# Patient Record
Sex: Female | Born: 2000 | Race: White | Smoking: Never smoker
Health system: Southern US, Community
[De-identification: ages and names within clinical notes are randomized; demographics above are authoritative.]

## PROBLEM LIST (undated history)

## (undated) DIAGNOSIS — J45909 Unspecified asthma, uncomplicated: Secondary | ICD-10-CM

## (undated) DIAGNOSIS — T7840XA Allergy, unspecified, initial encounter: Secondary | ICD-10-CM

## (undated) DIAGNOSIS — F32A Depression, unspecified: Secondary | ICD-10-CM

## (undated) DIAGNOSIS — F419 Anxiety disorder, unspecified: Secondary | ICD-10-CM

## (undated) HISTORY — PX: FOOT SURGERY: SHX648

## (undated) HISTORY — PX: ANKLE SURGERY: SHX546

## (undated) HISTORY — PX: TONSILLECTOMY AND ADENOIDECTOMY: SUR1326

## (undated) HISTORY — DX: Depression, unspecified: F32.A

## (undated) HISTORY — DX: Allergy, unspecified, initial encounter: T78.40XA

## (undated) HISTORY — PX: WISDOM TOOTH EXTRACTION: SHX21

---

## 2021-08-02 ENCOUNTER — Other Ambulatory Visit: Payer: Self-pay | Admitting: Orthopedic Surgery

## 2021-08-02 DIAGNOSIS — M79671 Pain in right foot: Secondary | ICD-10-CM

## 2021-08-08 ENCOUNTER — Other Ambulatory Visit: Payer: Self-pay

## 2021-08-08 ENCOUNTER — Ambulatory Visit
Admission: RE | Admit: 2021-08-08 | Discharge: 2021-08-08 | Disposition: A | Discharge status: Home / Self Care | Payer: 59 | Source: Ambulatory Visit | Attending: Orthopedic Surgery | Admitting: Orthopedic Surgery

## 2021-08-08 DIAGNOSIS — M79671 Pain in right foot: Secondary | ICD-10-CM | POA: Insufficient documentation

## 2021-11-07 ENCOUNTER — Ambulatory Visit
Admission: RE | Admit: 2021-11-07 | Discharge: 2021-11-07 | Disposition: A | Discharge status: Home / Self Care | Payer: 59 | Source: Ambulatory Visit | Attending: Family Medicine | Admitting: Family Medicine

## 2021-11-07 ENCOUNTER — Ambulatory Visit
Admission: RE | Admit: 2021-11-07 | Discharge: 2021-11-07 | Disposition: A | Discharge status: Home / Self Care | Payer: 59 | Attending: Family Medicine | Admitting: Family Medicine

## 2021-11-07 ENCOUNTER — Other Ambulatory Visit: Payer: Self-pay

## 2021-11-07 ENCOUNTER — Other Ambulatory Visit: Payer: Self-pay | Admitting: Family Medicine

## 2021-11-07 DIAGNOSIS — R52 Pain, unspecified: Secondary | ICD-10-CM

## 2021-11-20 ENCOUNTER — Ambulatory Visit: Payer: 59 | Admitting: Neurology

## 2021-11-20 ENCOUNTER — Telehealth: Payer: Self-pay | Admitting: Neurology

## 2021-11-20 NOTE — Telephone Encounter (Signed)
In response to an appointment request from 11-19-21 phone rep called pt to reschedule her appointment, phone rep left vm with office # asking for a call back to reschedule appointment.  This is FYI ?

## 2021-12-14 ENCOUNTER — Ambulatory Visit
Admission: RE | Admit: 2021-12-14 | Discharge: 2021-12-14 | Disposition: A | Discharge status: Home / Self Care | Payer: PRIVATE HEALTH INSURANCE | Source: Ambulatory Visit

## 2021-12-14 VITALS — BP 103/54 | HR 79 | Temp 98.2°F | Resp 16

## 2021-12-14 DIAGNOSIS — H60392 Other infective otitis externa, left ear: Secondary | ICD-10-CM | POA: Diagnosis not present

## 2021-12-14 DIAGNOSIS — H6012 Cellulitis of left external ear: Secondary | ICD-10-CM

## 2021-12-14 HISTORY — DX: Unspecified asthma, uncomplicated: J45.909

## 2021-12-14 HISTORY — DX: Anxiety disorder, unspecified: F41.9

## 2021-12-14 HISTORY — DX: Allergy, unspecified, initial encounter: T78.40XA

## 2021-12-14 MED ORDER — DOXYCYCLINE HYCLATE 100 MG PO CAPS: 100.0000 mg | ORAL_CAPSULE | Freq: Two times a day (BID) | ORAL | 0 refills | Status: AC

## 2021-12-14 NOTE — ED Provider Notes (Signed)
?[facility] ? ? ? ?CSN: [id number] ?Arrival date & time: 12/14/21  1314 ? ? ?  ? ?History   ?Chief Complaint ?Chief Complaint  ?Patient presents with  ? Otalgia  ?  left ear lobe pain   ? ? ?HPI ?Jasmine Kane is a 21 y.o. female.  ? 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  ?She endorses that inner ear canal papules is painful. She has no other symptoms. The area is reddened. Patient has no other symptoms. ?Past Medical History:  ?Diagnosis Date  ? Allergies   ? Anxiety   ? Asthma   ? ? ?There are no problems to display for this patient. ? ? ?History reviewed. No pertinent surgical history. ? ?OB History   ?No obstetric history on file. ?  ? ? ? ?Home Medications   ? ?Prior to Admission medications   ?Medication Sig Start Date End Date Taking? Authorizing Provider  ?doxycycline (VIBRAMYCIN) 100 MG capsule Take 1 capsule (100 mg total) by mouth 2 (two) times daily for 7 days. 12/14/21 12/21/21 Yes Bing Neighbors, FNP  ?sertraline (ZOLOFT) 100 MG tablet Take by mouth. 10/01/21  Yes [provider]  ?Cetirizine-Pseudoephedrine (ZYRTEC-D PO) ZyrTEC    [provider]  ?Montelukast Sodium (SINGULAIR PO) Singulair    [provider]  ? ? ?Family History ?History reviewed. No pertinent family history. ? ?Social History ?Social History  ? ?Tobacco Use  ? Smoking status: Never  ? Smokeless tobacco: Never  ?Vaping Use  ? Vaping Use: Never used  ?Substance Use Topics  ? Alcohol use: Never  ? Drug use: Never  ? ? ? ?Allergies   ?Mixed grasses ? ? ?Review of Systems ?Review of Systems ?Pertinent negatives listed in HPI  ? ?Physical Exam ?Triage Vital Signs ?ED Triage Vitals  ?Enc Vitals Group  ?   BP 12/14/21 1330 (!) 103/54  ?   Pulse Rate 12/14/21 1330 79  ?   Resp 12/14/21 1330 16  ?   Temp 12/14/21 1330 98.2 ?F (36.8 ?C)  ?   Temp Source 12/14/21 1330 Temporal  ?   SpO2 12/14/21 1330 97 %  ?   Weight  --   ?   Height --   ?   Head Circumference --   ?   Peak Flow --   ?   Pain Score 12/14/21 1333 7  ?   Pain Loc --   ?   Pain Edu? --   ?   Excl. in GC? --   ? ?No data found. ? ?Updated Vital Signs ?BP (!) 103/54 (BP Location: Left Arm)   Pulse 79   Temp 98.2 ?F (36.8 ?C) (Temporal)   Resp 16   SpO2 97%  ? ?Visual Acuity ?Right Eye Distance:   ?Left Eye Distance:   ?Bilateral Distance:   ? ?Right Eye Near:   ?Left Eye Near:    ?Bilateral Near:    ? ?Physical Exam ?General appearance: Alert, well developed, well nourished, cooperative  ?Head: Normocephalic, without obvious abnormality, atraumatic ?Respiratory: Respirations even and unlabored, normal respiratory rate ?Heart: Rate and rhythm normal.   ?Extremities: No gross deformities ?Skin: nodular papule auricle of left ear (consistent is scar tissue from prior piercing-non erythematous no drainage), turgor normal. Fine pustular papules inner ear canal with surrounding erythema present, no active drainage from lesion  ?Psych: Appropriate mood and affect. ? ? ?UC Treatments /  Results  ?Labs ?(all labs ordered are listed, but only abnormal results are displayed) ?Labs Reviewed - No data to display ? ?EKG ? ? ?Radiology ?No results found. ? ?Procedures ?Procedures (including critical care time) ? ?Medications Ordered in UC ?Medications - No data to display ? ?Initial Impression / Assessment and Plan / UC Course  ?I have reviewed the triage vital signs and the nursing notes. ? ?Pertinent labs & imaging results that were available during my care of the patient were reviewed by me and considered in my medical decision making (see chart for details). ? ?  ?Short course of doxycyline for inner ear cellulitis. Patient has other papules which appear to be clusters of eruptions suspicious for acne, doxycycline will be beneficial in clearing these eruptions. Follow-up with dermatology to discuss removal of scar tissue from prior piercing. ?Final Clinical Impressions(s) /  UC Diagnoses  ? ?Final diagnoses:  ?Cellulitis of left ear canal  ? ?Discharge Instructions   ?None ?  ? ?ED Prescriptions   ? ? Medication Sig Dispense Auth. Provider  ? doxycycline (VIBRAMYCIN) 100 MG capsule Take 1 capsule (100 mg total) by mouth 2 (two) times daily for 7 days. 14 capsule Bing Neighbors, FNP  ? ?  ? ?PDMP not reviewed this encounter. ?  ?Bing Neighbors, FNP ?12/18/21 1819 ? ?

## 2021-12-14 NOTE — ED Triage Notes (Signed)
Patient presents to Urgent Care with complaints of left ear lobe pain since 2 months. She states she was treated for ear lobe infection 2 months ago, she recently felt a pimple in her inner ear. She wants to make sure she does not have an infection.  ?

## 2022-03-28 ENCOUNTER — Ambulatory Visit: Payer: 59 | Admitting: Adult Health

## 2022-03-28 ENCOUNTER — Encounter: Payer: Self-pay | Admitting: Adult Health

## 2022-03-28 VITALS — Temp 98.5°F

## 2022-03-28 DIAGNOSIS — S65312A Laceration of deep palmar arch of left hand, initial encounter: Secondary | ICD-10-CM

## 2022-03-28 NOTE — Progress Notes (Signed)
[facility] [address] [place], [place] [address] Phone: [phone] Fax: [phone]   Office Visit Note  Patient Name: Jasmine Kane  Date of [id number]  Med Rec number [id number]  Date of Service: 03/28/2022  Mixed grasses  Chief Complaint  Patient presents with   Hand Injury    Pt cut right hand on can opener.      Hand Injury    Patient is here reporting about 30 minute ago she was using a can opener and slipped.  She cut the palm of her right hand at the base of her thumb. She has controlled the bleeding, but feels like she needs a couple stitches. Last tetanus 9 years ago.    Current Medication:  Outpatient Encounter Medications as of 03/28/2022  Medication Sig   Cetirizine-Pseudoephedrine (ZYRTEC-D PO) ZyrTEC   levonorgestrel (KYLEENA) 19.5 MG IUD 1 each by Intrauterine route once.   Montelukast Sodium (SINGULAIR PO) Singulair   sertraline (ZOLOFT) 50 MG tablet TAKE 1 TABLET BY MOUTH EVERY DAY WITH 25 MG SERTRALINE (75 MG TOTAL DAILY)   sertraline (ZOLOFT) 100 MG tablet Take by mouth. (Patient not taking: Reported on 03/28/2022)   No facility-administered encounter medications on file as of 03/28/2022.      Medical History: Past Medical History:  Diagnosis Date   Allergies    Anxiety    Asthma      Vital Signs: Temp 98.5 F (36.9 C) (Tympanic)    Review of Systems  Constitutional:  Negative for fatigue and fever.  Skin:  Positive for wound.    Physical Exam Vitals and nursing note reviewed.  Skin:    Findings: Laceration present.          Comments: 2cm laceration at base of left thumb.        Assessment/Plan: 1. Laceration of deep palmar arch of left hand, initial encounter Patient wound anesthestized with 1% lidocaine.  3 interruped sutures placed in laceartion.  Wound draessed with non-adherent bandage, triple antibiotic cream and coban. Return in one week for suture removal. Keep area clean and dry, do not submerge  in water of any kind.  - Tdap vaccine greater than or equal to 7yo IM     General Counseling: Amayra verbalizes understanding of the findings of todays visit and agrees with plan of treatment. I have discussed any further diagnostic evaluation that may be needed or ordered today. We also reviewed her medications today. she has been encouraged to call the office with any questions or concerns that should arise related to todays visit.   Orders Placed This Encounter  Procedures   Tdap vaccine greater than or equal to 7yo IM    No orders of the defined types were placed in this encounter.   Time spent:30 Minutes    Johnna Acosta AGNP-C Nurse Practitioner

## 2022-04-04 ENCOUNTER — Ambulatory Visit (INDEPENDENT_AMBULATORY_CARE_PROVIDER_SITE_OTHER): Payer: 59 | Admitting: Adult Health

## 2022-04-04 DIAGNOSIS — Z4802 Encounter for removal of sutures: Secondary | ICD-10-CM | POA: Diagnosis not present

## 2022-04-04 NOTE — Progress Notes (Signed)
[facility] [address] [place], [place] [address] Phone: [phone] Fax: [phone]   Office Visit Note  Patient Name: Jasmine Kane  Date of [id number]  Med Rec number [id number]  Date of Service: 04/04/2022  Mixed grasses  Chief Complaint  Patient presents with   Suture / Staple Removal     Suture / Staple Removal   Patient here for suture removal.  See previous OV note.  Sutures placed one week ago.    Current Medication:  Outpatient Encounter Medications as of 04/04/2022  Medication Sig   Cetirizine-Pseudoephedrine (ZYRTEC-D PO) ZyrTEC   levonorgestrel (KYLEENA) 19.5 MG IUD 1 each by Intrauterine route once.   Montelukast Sodium (SINGULAIR PO) Singulair   sertraline (ZOLOFT) 100 MG tablet Take by mouth. (Patient not taking: Reported on 03/28/2022)   sertraline (ZOLOFT) 50 MG tablet TAKE 1 TABLET BY MOUTH EVERY DAY WITH 25 MG SERTRALINE (75 MG TOTAL DAILY)   No facility-administered encounter medications on file as of 04/04/2022.      Medical History: Past Medical History:  Diagnosis Date   Allergies    Anxiety    Asthma      Vital Signs: There were no vitals taken for this visit.   Review of Systems  Constitutional: Negative.     Physical Exam Constitutional:      Appearance: Normal appearance.  Skin:    Comments: Right palmer surface with laceration.  Wound is well approximated, no redness or drainage noted. Three sutures in place.   Neurological:     Mental Status: She is alert.     Assessment/Plan: 1. Visit for suture removal 3 interrupted sutures removed.  Wound covered with steri-strips.  Follow up via MyChart messenger with any concerns, issues or questions.     General Counseling: Jasmine Kane verbalizes understanding of the findings of todays visit and agrees with plan of treatment. I have discussed any further diagnostic evaluation that may be needed or ordered today. We also reviewed her medications today. she has been  encouraged to call the office with any questions or concerns that should arise related to todays visit.   No orders of the defined types were placed in this encounter.   No orders of the defined types were placed in this encounter.   Time spent:15 Minutes    Johnna Acosta AGNP-C Nurse Practitioner

## 2022-04-08 ENCOUNTER — Encounter: Payer: Self-pay | Admitting: Family Medicine

## 2022-04-08 DIAGNOSIS — H1045 Other chronic allergic conjunctivitis: Secondary | ICD-10-CM | POA: Insufficient documentation

## 2022-04-08 DIAGNOSIS — J309 Allergic rhinitis, unspecified: Secondary | ICD-10-CM | POA: Insufficient documentation

## 2022-05-07 ENCOUNTER — Ambulatory Visit (INDEPENDENT_AMBULATORY_CARE_PROVIDER_SITE_OTHER): Payer: 59 | Admitting: Adult Health

## 2022-05-07 ENCOUNTER — Encounter: Payer: Self-pay | Admitting: Adult Health

## 2022-05-07 VITALS — BP 108/64 | HR 77 | Temp 99.5°F | Resp 18 | Ht 64.0 in | Wt 148.0 lb

## 2022-05-07 DIAGNOSIS — J22 Unspecified acute lower respiratory infection: Secondary | ICD-10-CM | POA: Diagnosis not present

## 2022-05-07 MED ORDER — AZITHROMYCIN 250 MG PO TABS: ORAL_TABLET | ORAL | 0 refills | Status: DC

## 2022-05-07 MED ORDER — AZITHROMYCIN 250 MG PO TABS: ORAL_TABLET | ORAL | 0 refills | Status: AC

## 2022-05-07 NOTE — Addendum Note (Signed)
Addended by: Johnna Acosta on: 05/07/2022 03:49 PM   Modules accepted: Orders

## 2022-05-07 NOTE — Progress Notes (Signed)
[facility] [address] [place], [place] [address] Phone: [phone] Fax: [phone]   Office Visit Note  Patient Name: Jasmine Kane  Date of [id number]  Med Rec number [id number]  Date of Service: 05/07/2022  Mixed grasses  No chief complaint on file.    HPI Patient reports just over a week ago she started having body aches and fatigue.  She reports it improved over the next two days.  About 4 days ago she started having difficulty breathing, and feeling like she was having asthma attacks. She reports coughing up mucous, and green mucous from her nose.  She has severe allergies, and is on xyzol, Flonase and Singulair.  She has asthma and has been using her albuterol inhaler.  She also has a breo but does not use it consistently.    Current Medication:  Outpatient Encounter Medications as of 05/07/2022  Medication Sig   azithromycin (ZITHROMAX) 250 MG tablet Take 2 tablets on day 1, then 1 tablet daily on days 2 through 5   Cetirizine-Pseudoephedrine (ZYRTEC-D PO) ZyrTEC   levonorgestrel (KYLEENA) 19.5 MG IUD 1 each by Intrauterine route once.   Montelukast Sodium (SINGULAIR PO) Singulair   sertraline (ZOLOFT) 50 MG tablet TAKE 1 TABLET BY MOUTH EVERY DAY WITH 25 MG SERTRALINE (75 MG TOTAL DAILY)   sertraline (ZOLOFT) 100 MG tablet Take by mouth. (Patient not taking: Reported on 05/07/2022)   No facility-administered encounter medications on file as of 05/07/2022.      Medical History: Past Medical History:  Diagnosis Date   Allergic rhinitis 04/08/2022   Allergies    Anxiety    Asthma      Vital Signs: BP 108/64   Pulse 77   Temp 99.5 F (37.5 C) (Tympanic)   Resp 18   Ht 5\' 4"  (1.626 m)   Wt 148 lb (67.1 kg)   SpO2 99%   BMI 25.40 kg/m    Review of Systems  HENT:  Positive for sinus pressure and sore throat.   Respiratory:  Positive for cough and wheezing.     Physical Exam Constitutional:      Appearance: Normal appearance.  HENT:      Right Ear: Tympanic membrane and ear canal normal.     Left Ear: Tympanic membrane and ear canal normal.     Nose: Nose normal.     Mouth/Throat:     Mouth: Mucous membranes are moist.  Eyes:     Pupils: Pupils are equal, round, and reactive to light.  Cardiovascular:     Rate and Rhythm: Normal rate.  Pulmonary:     Effort: Pulmonary effort is normal.     Breath sounds: Wheezing present. No rales.     Comments: Mild course breath sounds in lower lungs.  Abdominal:     General: Abdomen is flat.  Neurological:     Mental Status: She is alert.    Assessment/Plan: 1. Lower respiratory infection Covid negative in clinic Take complete course of antibiotics as prescribed.  Take with food.  If symptoms fail to improve, or new/worse symptoms develop follow up in clinic.  Follow up via MyChart messenger if symptoms fail to improve or may return to clinic as needed for worsening symptoms.  USE BREO daily as prescribed.  Continue albuterol.  - azithromycin (ZITHROMAX) 250 MG tablet; Take 2 tablets on day 1, then 1 tablet daily on days 2 through 5  Dispense: 6 tablet; Refill: 0        General  Counseling: Jasmine Kane verbalizes understanding of the findings of todays visit and agrees with plan of treatment. I have discussed any further diagnostic evaluation that may be needed or ordered today. We also reviewed her medications today. she has been encouraged to call the office with any questions or concerns that should arise related to todays visit.   No orders of the defined types were placed in this encounter.   Meds ordered this encounter  Medications   azithromycin (ZITHROMAX) 250 MG tablet    Sig: Take 2 tablets on day 1, then 1 tablet daily on days 2 through 5    Dispense:  6 tablet    Refill:  0    Time spent:20 Minutes    Johnna Acosta AGNP-C Nurse Practitioner

## 2022-10-03 ENCOUNTER — Ambulatory Visit (INDEPENDENT_AMBULATORY_CARE_PROVIDER_SITE_OTHER): Payer: Commercial Managed Care - PPO | Admitting: Family Medicine

## 2022-10-03 ENCOUNTER — Encounter: Payer: Self-pay | Admitting: Family Medicine

## 2022-10-03 VITALS — BP 114/62 | HR 95 | Temp 99.1°F | Ht 66.0 in | Wt 150.0 lb

## 2022-10-03 DIAGNOSIS — R509 Fever, unspecified: Secondary | ICD-10-CM

## 2022-10-03 DIAGNOSIS — J101 Influenza due to other identified influenza virus with other respiratory manifestations: Secondary | ICD-10-CM

## 2022-10-03 LAB — POC SOFIA 2 FLU + SARS ANTIGEN FIA
Influenza A, POC: POSITIVE — AB
Influenza B, POC: NEGATIVE
SARS Coronavirus 2 Ag: NEGATIVE

## 2022-10-03 MED ORDER — OSELTAMIVIR PHOSPHATE 75 MG PO CAPS: 75.0000 mg | ORAL_CAPSULE | Freq: Two times a day (BID) | ORAL | 0 refills | Status: AC

## 2022-10-03 NOTE — Progress Notes (Signed)
[address] 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  Fever  Associated symptoms include congestion.      Current Medication:  Outpatient Encounter Medications as of 10/03/2022  Medication Sig   Cetirizine-Pseudoephedrine (ZYRTEC-D PO) ZyrTEC   fluticasone (FLONASE ALLERGY RELIEF) 50 MCG/ACT nasal spray Place 2 sprays into both nostrils daily.   levocetirizine (XYZAL) 5 MG tablet Take 2.5 mg by mouth every evening.   levonorgestrel (KYLEENA) 19.5 MG IUD 1 each by Intrauterine route once.   Montelukast Sodium (SINGULAIR PO) Singulair   sertraline (ZOLOFT) 100 MG tablet Take by mouth. (Patient not taking: Reported on 05/07/2022)   sertraline (ZOLOFT) 50 MG tablet TAKE 1 TABLET BY MOUTH EVERY DAY WITH 25 MG SERTRALINE (75 MG TOTAL DAILY) (Patient not taking: Reported on 10/03/2022)   No facility-administered encounter medications on file as of 10/03/2022.      Medical History: Past Medical History:  Diagnosis Date   Allergic rhinitis 04/08/2022   Allergies    Anxiety    Asthma      Vital Signs: BP 114/62   Pulse 95   Temp 99.1 F (37.3 C) (Tympanic)   Ht 5\' 6"  (1.676 m)   Wt 150 lb (68 kg)   SpO2 99%   BMI 24.21 kg/m    Review of Systems  Constitutional:  Positive for fatigue and fever.  HENT:  Positive for congestion and rhinorrhea.   Musculoskeletal:  Positive for myalgias.    Physical  Exam Vitals reviewed.  Constitutional:      Comments: Looks tired  HENT:     Right Ear: Tympanic membrane normal.     Left Ear: Tympanic membrane normal.     Nose: Congestion and rhinorrhea present. Rhinorrhea is clear.     Right Sinus: No maxillary sinus tenderness or frontal sinus tenderness.     Left Sinus: No maxillary sinus tenderness or frontal sinus tenderness.     Mouth/Throat:     Mouth: Mucous membranes are moist.     Pharynx: Oropharynx is clear. Uvula midline. No oropharyngeal exudate or posterior oropharyngeal erythema.  Eyes:     Conjunctiva/sclera: Conjunctivae normal.  Pulmonary:     Effort: Pulmonary effort is normal.     Breath sounds: Normal breath sounds and air entry.  Lymphadenopathy:     Cervical: No cervical adenopathy.  Neurological:     Mental Status: She is alert.     Assessment/Plan:  1. Fever, unspecified fever cause  - POC SOFIA 2 FLU + SARS ANTIGEN FIA  Results for orders placed or performed in visit on 10/03/22 (from the past 24 hour(s))  POC SOFIA 2 FLU + SARS ANTIGEN FIA     Status: Abnormal   Collection Time: 10/03/22  3:21 PM  Result Value Ref Range   Influenza A, POC Positive (A) Negative   Influenza B, POC Negative Negative   SARS Coronavirus 2 Ag Negative  Negative     2. Flu due to oth ident influenza virus w oth resp manifest  - oseltamivir (TAMIFLU) 75 MG capsule; Take 1 capsule (75 mg total) by mouth 2 (two) times daily for 5 days.  Dispense: 10 capsule; Refill: 0    May return to class when fever below 100.5  General Counseling: Jasmine Kane verbalizes understanding of the findings of todays visit and agrees with plan of treatment. I have discussed any further diagnostic evaluation that may be needed or ordered today. We also reviewed her medications today. she has been encouraged to call the office with any questions or concerns that should arise related to todays visit.   No orders of the defined types were placed in this  encounter.   No orders of the defined types were placed in this encounter.     Dr Evette Doffing Emilian Stawicki ABFM University Physician

## 2022-11-12 ENCOUNTER — Ambulatory Visit (INDEPENDENT_AMBULATORY_CARE_PROVIDER_SITE_OTHER): Payer: Commercial Managed Care - PPO | Admitting: Family Medicine

## 2022-11-12 VITALS — BP 122/70 | HR 63 | Temp 98.5°F | Resp 18 | Ht 66.0 in | Wt 150.0 lb

## 2022-11-12 DIAGNOSIS — J3089 Other allergic rhinitis: Secondary | ICD-10-CM

## 2022-11-12 DIAGNOSIS — J0191 Acute recurrent sinusitis, unspecified: Secondary | ICD-10-CM | POA: Diagnosis not present

## 2022-11-12 DIAGNOSIS — L729 Follicular cyst of the skin and subcutaneous tissue, unspecified: Secondary | ICD-10-CM | POA: Diagnosis not present

## 2022-11-12 MED ORDER — AMOXICILLIN-POT CLAVULANATE 875-125 MG PO TABS: 1.0000 | ORAL_TABLET | Freq: Two times a day (BID) | ORAL | 0 refills | Status: DC

## 2022-11-12 NOTE — Progress Notes (Signed)
[address] [place], [place] [address] Phone: [phone] Fax: [phone]   Office Visit Note  Patient Name: Jasmine Kane  Date of [id number]  Med Rec number [id number]  Date of Service: 11/12/2022  Mixed grasses  Chief Complaint  Patient presents with   Ear Pain     Left ear swelling a few weeks ago and felt like a pimple at the start and can't  put her ear bud in  Has been taking her allergy meds as usual but today woke up with fever 100.5, and sinus pain (has taken advil) Has history of recurrent sinus infection and takes her allergy medications year round  Currently taking Tizanidine as needed at bedtime for back pain      Current Medication:  Outpatient Encounter Medications as of 11/12/2022  Medication Sig   Cetirizine-Pseudoephedrine (ZYRTEC-D PO) ZyrTEC   fluticasone (FLONASE ALLERGY RELIEF) 50 MCG/ACT nasal spray Place 2 sprays into both nostrils daily.   levocetirizine (XYZAL) 5 MG tablet Take 2.5 mg by mouth every evening.   levonorgestrel (KYLEENA) 19.5 MG IUD 1 each by Intrauterine route once.   Montelukast Sodium (SINGULAIR PO) Singulair   sertraline (ZOLOFT) 100 MG tablet Take by mouth. (Patient not taking: Reported on 05/07/2022)   sertraline (ZOLOFT) 50 MG tablet TAKE 1 TABLET BY MOUTH EVERY DAY WITH 25 MG SERTRALINE (75 MG TOTAL DAILY) (Patient not taking: Reported on 10/03/2022)   No facility-administered encounter medications on file as of 11/12/2022.      Medical History: Past Medical History:  Diagnosis Date   Allergic rhinitis 04/08/2022   Allergies    Anxiety    Asthma      Vital Signs: BP 122/70   Pulse 63   Temp 98.5 F (36.9 C) (Tympanic)   Resp 18   Ht '5\' 6"'$  (1.676 m)   Wt 150 lb (68 kg)   SpO2 99%   BMI 24.21 kg/m    Review of Systems  Constitutional:  Positive for fever.  HENT:  Positive for sinus pressure and sinus pain.   Musculoskeletal:  Positive for back pain.  Allergic/Immunologic:  Positive for environmental allergies.    Physical Exam Vitals reviewed.  Constitutional:      General: She is not in acute distress.    Appearance: She is not ill-appearing.  HENT:     Right Ear: Tympanic membrane and external ear normal.     Left Ear: Tympanic membrane normal.     Ears:     Comments: Fluid-filled cyst just inside left ear canal - not red or tender to palpation    Nose: Nasal tenderness, congestion and rhinorrhea present.     Right Turbinates: Swollen.     Left Turbinates: Swollen.     Mouth/Throat:     Mouth: Mucous membranes are moist.     Pharynx: Uvula midline. No pharyngeal swelling, oropharyngeal exudate or posterior oropharyngeal erythema.  Neurological:     Mental Status: She is alert.    Assessment/Plan:  1. Acute recurrent sinusitis, unspecified location  - amoxicillin-clavulanate (AUGMENTIN) 875-125 MG tablet; Take 1 tablet by mouth 2 (two) times daily.  Dispense: 20 tablet; Refill: 0  2. Non-seasonal allergic rhinitis, unspecified trigger Continue current medications but increase flonase to 2 sprays in each nostril 2 times daily  3. Cyst of skin If cyst does not resolve with antibiotics may need to punctured. Jeff will schedule return appointment in 8 or 9 days if needs this done  General Counseling: Henriette verbalizes understanding of the findings of todays visit and agrees with plan of treatment. I have discussed any further diagnostic evaluation that may be needed or ordered today. We also reviewed her medications today. she has been encouraged to call the office with any questions or concerns that should arise related to todays visit.   No orders of the defined types were placed in this encounter.   No orders of the defined types were placed in this encounter.    Dr Evette Doffing Cherry Wittwer ABFM University Physician

## 2022-11-13 ENCOUNTER — Other Ambulatory Visit: Payer: Self-pay | Admitting: Sports Medicine

## 2022-11-13 DIAGNOSIS — M62830 Muscle spasm of back: Secondary | ICD-10-CM

## 2022-11-13 DIAGNOSIS — G8929 Other chronic pain: Secondary | ICD-10-CM

## 2022-11-14 ENCOUNTER — Encounter: Payer: Self-pay | Admitting: Family Medicine

## 2022-11-20 ENCOUNTER — Encounter: Payer: Self-pay | Admitting: Family Medicine

## 2022-11-20 ENCOUNTER — Ambulatory Visit (INDEPENDENT_AMBULATORY_CARE_PROVIDER_SITE_OTHER): Payer: Commercial Managed Care - PPO | Admitting: Family Medicine

## 2022-11-20 VITALS — Temp 97.9°F | Wt 146.0 lb

## 2022-11-20 DIAGNOSIS — R21 Rash and other nonspecific skin eruption: Secondary | ICD-10-CM

## 2022-11-20 DIAGNOSIS — L729 Follicular cyst of the skin and subcutaneous tissue, unspecified: Secondary | ICD-10-CM

## 2022-11-20 DIAGNOSIS — R0981 Nasal congestion: Secondary | ICD-10-CM | POA: Diagnosis not present

## 2022-11-20 NOTE — Progress Notes (Signed)
[address] [place], [place] [address] Phone: [phone] Fax: [phone]   Office Visit Note  Patient Name: Jasmine Kane  Date of [id number]  Med Rec number [id number]  Date of Service: 11/20/2022  Mixed grasses  Chief Complaint  Patient presents with   Follow-up     See last OV notes and secure messages  Sinus symptoms improved but still has lots of nasal congestion and discharge despite using flonase Thinks cyst in ear has improved a little but still can't use her earbuds without pain  Has other rashes on arms but her dermatology appointment on 3/13 was cancelled by the dermatology office so would like this checked - used to shave her arms and the rashes are from that and her underlying eczema Has very dry hands and is using a shea butter/coconut oil cream on them      Current Medication:  Outpatient Encounter Medications as of 11/20/2022  Medication Sig   fluticasone (FLONASE ALLERGY RELIEF) 50 MCG/ACT nasal spray Place 2 sprays into both nostrils daily.   levocetirizine (XYZAL) 5 MG tablet Take 2.5 mg by mouth every evening.   levonorgestrel (KYLEENA) 19.5 MG IUD 1 each by Intrauterine route once.   Montelukast Sodium (SINGULAIR PO) Singulair   tiZANidine (ZANAFLEX) 4 MG tablet Take 4 mg by mouth as directed.   amoxicillin-clavulanate (AUGMENTIN) 875-125 MG tablet Take 1 tablet by mouth 2 (two) times daily. (Patient not taking: Reported on 11/20/2022)   No facility-administered encounter medications on file as of 11/20/2022.      Medical History: Past Medical History:  Diagnosis Date   Allergic rhinitis 04/08/2022   Allergies    Anxiety    Asthma      Vital Signs: Temp 97.9 F (36.6 C) (Tympanic)   Wt 146 lb (66.2 kg)   BMI 23.57 kg/m    Review of Systems  Constitutional: Negative.     Physical Exam Vitals reviewed.  Constitutional:      Appearance: Normal appearance.  HENT:     Right Ear: Tympanic membrane  normal.     Left Ear: Tympanic membrane normal.     Ears:      Comments: Cyst in left canal smaller - firmer Also has a small "pimple" and a "blackhead" inside left pinna    Nose: Congestion present.     Right Turbinates: Swollen.     Left Turbinates: Swollen.     Right Sinus: No maxillary sinus tenderness or frontal sinus tenderness.     Left Sinus: No maxillary sinus tenderness or frontal sinus tenderness.  Skin:    Findings: Rash present. Rash is papular.          Comments: Hands dry  Neurological:     Mental Status: She is alert.       Assessment/Plan:  1. Cyst of skin Not amenable to I&D today - use alcohol swab to clean inner left pinna and the cyst in the canal Clean ear buds daily with alcohol swabs  2. Nasal congestion Use Afrin at bedtime for 4 nights Continue flonase  3. Rash Use topical antibiotic, take loratadine 10 mg two times a day and do not shave arms In general, since she has sensitive skin advise Schick Intuition razor with soap for sensitive skin      General Counseling: Alixandria verbalizes understanding of the findings of todays visit and agrees with plan of treatment. I have discussed any further diagnostic evaluation that may be needed or ordered today. We  also reviewed her medications today. she has been encouraged to call the office with any questions or concerns that should arise related to todays visit.   No orders of the defined types were placed in this encounter.   No orders of the defined types were placed in this encounter.    Dr Evette Doffing Ever Halberg ABFM University Physician

## 2023-01-13 ENCOUNTER — Encounter: Payer: Commercial Managed Care - PPO | Admitting: Family Medicine

## 2023-01-16 ENCOUNTER — Encounter: Payer: Self-pay | Admitting: Family Medicine

## 2023-01-17 ENCOUNTER — Encounter: Payer: Self-pay | Admitting: Family Medicine

## 2023-01-17 ENCOUNTER — Ambulatory Visit (INDEPENDENT_AMBULATORY_CARE_PROVIDER_SITE_OTHER): Payer: Commercial Managed Care - PPO | Admitting: Family Medicine

## 2023-01-17 ENCOUNTER — Other Ambulatory Visit: Payer: Self-pay

## 2023-01-17 VITALS — BP 98/61 | HR 74 | Temp 96.8°F | Ht 65.98 in | Wt 144.0 lb

## 2023-01-17 DIAGNOSIS — E162 Hypoglycemia, unspecified: Secondary | ICD-10-CM

## 2023-01-17 DIAGNOSIS — R55 Syncope and collapse: Secondary | ICD-10-CM | POA: Diagnosis not present

## 2023-01-17 DIAGNOSIS — Z862 Personal history of diseases of the blood and blood-forming organs and certain disorders involving the immune mechanism: Secondary | ICD-10-CM

## 2023-01-17 DIAGNOSIS — Z83438 Family history of other disorder of lipoprotein metabolism and other lipidemia: Secondary | ICD-10-CM

## 2023-01-17 DIAGNOSIS — M2550 Pain in unspecified joint: Secondary | ICD-10-CM

## 2023-01-17 DIAGNOSIS — M24229 Disorder of ligament, unspecified elbow: Secondary | ICD-10-CM

## 2023-01-17 DIAGNOSIS — R413 Other amnesia: Secondary | ICD-10-CM

## 2023-01-17 DIAGNOSIS — J45909 Unspecified asthma, uncomplicated: Secondary | ICD-10-CM

## 2023-01-17 DIAGNOSIS — Z8349 Family history of other endocrine, nutritional and metabolic diseases: Secondary | ICD-10-CM | POA: Diagnosis not present

## 2023-01-17 DIAGNOSIS — R5383 Other fatigue: Secondary | ICD-10-CM

## 2023-01-17 DIAGNOSIS — Z8659 Personal history of other mental and behavioral disorders: Secondary | ICD-10-CM

## 2023-01-17 NOTE — Progress Notes (Signed)
[facility] [address] [place], [place] [address] Phone: [phone] Fax: [phone]   Office Visit Note  Patient Name: Jasmine Kane  Date of [id number]:[id number]  Med Rec number [id number]  Date of Service: 01/18/2023  Mixed grasses  Chief Complaint  Patient presents with   Annual Exam     Rising senior with requests for overall check-up/physical Family have moved and currently does not have a PCP. Has not seen anyone for a while and now recent blood work to assess her risk of familial health issues  Has history of "blacking out" when standing up - vision goes black, clammy, occasional nausea sits down and drinks water - happens in Pilates, and when stands from sitting, even just in class Worse when goes upside down eg handstands History of migraines when younger When gets up in the morning feels very hungry, stomach feels empty and squirly and she feels nauseous and lightheaded feels better after minimal intake eg has one spoon of yoghurt No actual fainting - has been an issue all academic year Had Covid 09/2020  Gets lightheaded during the day which is helped by small amount of food -eats every 2 to 3 hours, focus on protein  Increased fatigue - takes magnesium at bedtime which helps this Has alternating diarrhea and constipation - stress-induced, no blood in stool Rare bloating H/o eating disorder - resolved States she has a very poor memory  Also occasional headaches different from her migraines  PMH anemia - heavy menses prior to IUD; asthma and allergies  FH - HTN high lipids, thyroid disorders          Current Medication:  Outpatient Encounter Medications as of 01/17/2023  Medication Sig   Azelastine HCl 137 MCG/SPRAY SOLN Place into both nostrils.   fluticasone (FLONASE ALLERGY RELIEF) 50 MCG/ACT nasal spray Place 2 sprays into both nostrils daily.   levocetirizine (XYZAL) 5 MG tablet Take 2.5 mg by mouth every evening.   levonorgestrel  (KYLEENA) 19.5 MG IUD 1 each by Intrauterine route once.   Montelukast Sodium (SINGULAIR PO) Singulair   [DISCONTINUED] amoxicillin-clavulanate (AUGMENTIN) 875-125 MG tablet Take 1 tablet by mouth 2 (two) times daily. (Patient not taking: Reported on 11/20/2022)   No facility-administered encounter medications on file as of 01/17/2023.      Medical History: Past Medical History:  Diagnosis Date   Allergic rhinitis 04/08/2022   Allergies    Anxiety    Asthma      Vital Signs: BP 98/61   Pulse 74   Temp (!) 96.8 F (36 C) (Tympanic)   Ht 5' 5.98" (1.676 m)   Wt 144 lb (65.3 kg)   SpO2 98%   BMI 23.25 kg/m    Review of Systems  Constitutional:  Positive for fatigue. Negative for fever and unexpected weight change.  Musculoskeletal:  Positive for arthralgias and back pain. Negative for gait problem and joint swelling.  Neurological:  Positive for dizziness and headaches.  Hematological:  Does not bruise/bleed easily.  Psychiatric/Behavioral: Negative.      Physical Exam Vitals reviewed.  Constitutional:      General: She is not in acute distress.    Appearance: She is normal weight. She is not ill-appearing.     Comments: Looks pale and tired but not unwell  HENT:     Right Ear: No middle ear effusion.     Left Ear:  No middle ear effusion.     Nose: No congestion.     Right Sinus:  No maxillary sinus tenderness or frontal sinus tenderness.     Left Sinus: No maxillary sinus tenderness or frontal sinus tenderness.     Mouth/Throat:     Pharynx: Oropharynx is clear.  Eyes:     General: Gaze aligned appropriately.     Extraocular Movements: Extraocular movements intact.     Conjunctiva/sclera: Conjunctivae normal.  Neck:     Vascular: No carotid bruit.  Cardiovascular:     Rate and Rhythm: Normal rate and regular rhythm. No extrasystoles are present.    Pulses: Normal pulses.     Heart sounds: Normal heart sounds. No murmur heard.    Comments: Not orthostatic -  minimal change in heart and BP from lying to standing but subjective dizziness Pulmonary:     Effort: Pulmonary effort is normal.     Breath sounds: Normal breath sounds and air entry.  Abdominal:     General: Abdomen is flat.     Palpations: There is no hepatomegaly or splenomegaly.     Tenderness: There is no abdominal tenderness.  Musculoskeletal:     Right lower leg: No edema.     Left lower leg: No edema.     Comments: Hyperextension at elbows, increased flexibility at mcp joints  Lymphadenopathy:     Cervical: No cervical adenopathy.  Neurological:     Mental Status: She is alert.     Cranial Nerves: Cranial nerves 2-12 are intact.     Motor: Motor function is intact.  Psychiatric:        Cognition and Memory: Memory is impaired.     Assessment/Plan:  1. Pre-syncope  - Comp Met (CMET) - Insulin, random - Hemoglobin A1c - Iron and TIBC([facility]/Sunquest)  2. Hypoglycemia From history - possible reactive - Insulin, random - Hemoglobin A1c  3. Family history of thyroid disease  - TSH  4. Family history of hyperlipidemia  - Lipid Profile  5. Arthralgia, unspecified joint  - TSH - Vitamin D (25 hydroxy) - Sedimentation rate  6. Ligamentous laxity of elbow, unspecified laterality Long-standing - clinically doessnot appear to be E-DS  7. Fatigue, unspecified type Does not appear to be post-covid given time lapse between last known infection and symptom onset - TSH - CBC w/Diff - Comp Met (CMET) - Vitamin D (25 hydroxy) - Sedimentation rate - Iron and TIBC([facility]/Sunquest)  8. Asthma, unspecified asthma severity, unspecified whether complicated, unspecified whether persistent  - Vitamin D (25 hydroxy)  9. History of anemia  - CBC w/Diff - B12 - Iron and TIBC([facility]/Sunquest)  10. History of eating disorder resolved - CBC w/Diff - Comp Met (CMET) - Vitamin D (25 hydroxy) - Lipid Profile  11. Memory difficulties chronic      General  Counseling: [name] verbalizes understanding of the findings of todays visit and agrees with plan of treatment. I have discussed any further diagnostic evaluation that may be needed or ordered today. We also reviewed her medications today. she has been encouraged to call the office with any questions or concerns that should arise related to todays visit.   Orders Placed This Encounter  Procedures   TSH   CBC w/Diff   Comp Met (CMET)   Vitamin D (25 hydroxy)   Lipid Profile   Insulin, random   Hemoglobin A1c   Sedimentation rate   B12   Iron and TIBC([facility]/Sunquest)    No orders of the defined types were placed in this encounter.    Dr Durwin Reges Yomaris Palecek ABFM University Physician

## 2023-01-18 LAB — COMPREHENSIVE METABOLIC PANEL
ALT: 17 IU/L (ref 0–32)
AST: 16 IU/L (ref 0–40)
Albumin/Globulin Ratio: 2.3 — ABNORMAL HIGH (ref 1.2–2.2)
Albumin: 4.5 g/dL (ref 4.0–5.0)
Alkaline Phosphatase: 85 IU/L (ref 42–106)
BUN/Creatinine Ratio: 21 (ref 9–23)
BUN: 14 mg/dL (ref 6–20)
Bilirubin Total: 0.3 mg/dL (ref 0.0–1.2)
CO2: 20 mmol/L (ref 20–29)
Calcium: 9.4 mg/dL (ref 8.7–10.2)
Chloride: 103 mmol/L (ref 96–106)
Creatinine, Ser: 0.68 mg/dL (ref 0.57–1.00)
Globulin, Total: 2 g/dL (ref 1.5–4.5)
Glucose: 92 mg/dL (ref 70–99)
Potassium: 4.2 mmol/L (ref 3.5–5.2)
Sodium: 139 mmol/L (ref 134–144)
Total Protein: 6.5 g/dL (ref 6.0–8.5)
eGFR: 128 mL/min/{1.73_m2} (ref >59)

## 2023-01-18 LAB — CBC WITH DIFFERENTIAL/PLATELET
Basophils Absolute: 0 10*3/uL (ref 0.0–0.2)
Basos: 1 %
EOS (ABSOLUTE): 0.1 10*3/uL (ref 0.0–0.4)
Eos: 2 %
Hematocrit: 42.9 % (ref 34.0–46.6)
Hemoglobin: 14.4 g/dL (ref 11.1–15.9)
Immature Grans (Abs): 0 10*3/uL (ref 0.0–0.1)
Immature Granulocytes: 0 %
Lymphocytes Absolute: 1.6 10*3/uL (ref 0.7–3.1)
Lymphs: 30 %
MCH: 29.7 pg (ref 26.6–33.0)
MCHC: 33.6 g/dL (ref 31.5–35.7)
MCV: 89 fL (ref 79–97)
Monocytes Absolute: 0.3 10*3/uL (ref 0.1–0.9)
Monocytes: 6 %
Neutrophils Absolute: 3.2 10*3/uL (ref 1.4–7.0)
Neutrophils: 61 %
Platelets: 187 10*3/uL (ref 150–450)
RBC: 4.85 x10E6/uL (ref 3.77–5.28)
RDW: 12.2 % (ref 11.7–15.4)
WBC: 5.3 10*3/uL (ref 3.4–10.8)

## 2023-01-18 LAB — VITAMIN B12: Vitamin B-12: 521 pg/mL (ref 232–1245)

## 2023-01-18 LAB — LIPID PANEL
Chol/HDL Ratio: 2.5 ratio (ref 0.0–4.4)
Cholesterol, Total: 147 mg/dL (ref 100–199)
HDL: 58 mg/dL (ref >39)
LDL Chol Calc (NIH): 79 mg/dL (ref 0–99)
Triglycerides: 47 mg/dL (ref 0–149)
VLDL Cholesterol Cal: 10 mg/dL (ref 5–40)

## 2023-01-18 LAB — IRON AND TIBC
Iron Saturation: 38 % (ref 15–55)
Iron: 113 ug/dL (ref 27–159)
Total Iron Binding Capacity: 301 ug/dL (ref 250–450)
UIBC: 188 ug/dL (ref 131–425)

## 2023-01-18 LAB — SEDIMENTATION RATE: Sed Rate: 2 mm/hr (ref 0–32)

## 2023-01-18 LAB — VITAMIN D 25 HYDROXY (VIT D DEFICIENCY, FRACTURES): Vit D, 25-Hydroxy: 29.4 ng/mL — ABNORMAL LOW (ref 30.0–100.0)

## 2023-01-18 LAB — HEMOGLOBIN A1C
Est. average glucose Bld gHb Est-mCnc: 105 mg/dL
Hgb A1c MFr Bld: 5.3 % (ref 4.8–5.6)

## 2023-01-18 LAB — TSH: TSH: 0.436 u[IU]/mL — ABNORMAL LOW (ref 0.450–4.500)

## 2023-01-18 LAB — INSULIN, RANDOM: INSULIN: 10.1 u[IU]/mL (ref 2.6–24.9)

## 2023-01-18 NOTE — Progress Notes (Signed)
Good morning Jasmine Kane  Your blood test results are back and overall are okay but there are two slight abnormalities. The first is that your thyroid test (TSH) is borderline overactive. This should be checked again in 8 weeks to see if this is the start of a thyroid problem or just a temporary blip. The second is that your Vitamin D is borderline low - we like to see it around 40 or 50, although "normal" is regarded as between 30 and 100. I advise starting a Vitamin D supplement of 1,000 IU each day. Your blood sugar, insulin, Vitamin B 12 and iron levels are all fine, as are your kidney and liver function tests. To manage your symptoms I advise adding salt to your meals, have a snack at bedtime with protein and salt, and be consistent with lower body cardio exercise as we discussed yesterday. You  may also want to consider wearing compression stockings to help the venous return from your legs - these can be hot and/or uncomfortable so I would try the other things first  Dr Stanford Scotland A Ashten Prats  Let me know if you have any questions or if anything changes

## 2023-03-10 ENCOUNTER — Encounter: Payer: Self-pay | Admitting: Adult Health

## 2023-03-10 ENCOUNTER — Other Ambulatory Visit: Payer: Self-pay

## 2023-03-10 ENCOUNTER — Ambulatory Visit (INDEPENDENT_AMBULATORY_CARE_PROVIDER_SITE_OTHER): Payer: Commercial Managed Care - PPO | Admitting: Adult Health

## 2023-03-10 VITALS — BP 101/78 | HR 67 | Temp 97.9°F

## 2023-03-10 DIAGNOSIS — R7989 Other specified abnormal findings of blood chemistry: Secondary | ICD-10-CM

## 2023-03-10 NOTE — Progress Notes (Signed)
[facility] [address] [place], [place] [address] Phone: [phone] Fax: [phone]   Office Visit Note  Patient Name: Jasmine Kane  Date of [id number]:[id number]  Med Rec number [id number]  Date of Service: 03/10/2023  Mixed grasses  Chief Complaint  Patient presents with   Follow-up     HPI  Patient is here for repeat labs.  Her Vit D was a little low and her TSh was as well.  See previous OV note on May 17th, 2024.  She denies any new or concerning symptoms. She has been taking her Vit D supplement as discussed.   Current Medication:  Outpatient Encounter Medications as of 03/10/2023  Medication Sig   Azelastine HCl 137 MCG/SPRAY SOLN Place into both nostrils.   fluticasone (FLONASE ALLERGY RELIEF) 50 MCG/ACT nasal spray Place 2 sprays into both nostrils daily.   levocetirizine (XYZAL) 5 MG tablet Take 2.5 mg by mouth every evening.   levonorgestrel (KYLEENA) 19.5 MG IUD 1 each by Intrauterine route once.   Montelukast Sodium (SINGULAIR PO) Singulair   No facility-administered encounter medications on file as of 03/10/2023.      Medical History: Past Medical History:  Diagnosis Date   Allergic rhinitis 04/08/2022   Allergies    Anxiety    Asthma      Vital Signs: BP 101/78   Pulse 67   Temp 97.9 F (36.6 C) (Tympanic)   SpO2 99%    Review of Systems  Constitutional:  Negative for activity change, appetite change, chills, fatigue and fever.  HENT:  Negative for congestion, postnasal drip, sinus pain and sore throat.   Eyes:  Negative for pain, discharge and itching.  Respiratory:  Negative for cough and shortness of breath.   Cardiovascular:  Negative for chest pain, palpitations and leg swelling.  Gastrointestinal:  Negative for abdominal distention and abdominal pain.  Endocrine: Negative for cold intolerance and heat intolerance.  Genitourinary:  Negative for difficulty urinating and flank pain.  Musculoskeletal:  Negative for arthralgias and  joint swelling.  Skin:  Negative for color change and rash.  Neurological:  Negative for dizziness and headaches.  Hematological:  Negative for adenopathy.  Psychiatric/Behavioral:  Negative for agitation and confusion.     Physical Exam Vitals and nursing note reviewed.  Constitutional:      Appearance: Normal appearance.  HENT:     Head: Normocephalic.  Neurological:     Mental Status: She is alert.  Psychiatric:        Mood and Affect: Mood normal.        Behavior: Behavior normal.    Assessment/Plan: 1. Low TSH level Results in mychart when available.  - TSH  2. Low vitamin D level Results in mychart when available.  - VITAMIN D 25 Hydroxy (Vit-D Deficiency, Fractures)     General Counseling: Jasmine Kane verbalizes understanding of the findings of todays visit and agrees with plan of treatment. I have discussed any further diagnostic evaluation that may be needed or ordered today. We also reviewed her medications today. she has been encouraged to call the office with any questions or concerns that should arise related to todays visit.   Orders Placed This Encounter  Procedures   TSH   VITAMIN D 25 Hydroxy (Vit-D Deficiency, Fractures)    No orders of the defined types were placed in this encounter.   Time spent:25 Minutes Time spent includes review of chart, medications, test results, and follow up plan with the patient.    Madelaine Bhat  Brooks Sailors AGNP-C Nurse Practitioner

## 2023-03-11 ENCOUNTER — Encounter: Payer: Self-pay | Admitting: Adult Health

## 2023-03-11 LAB — VITAMIN D 25 HYDROXY (VIT D DEFICIENCY, FRACTURES): Vit D, 25-Hydroxy: 30.1 ng/mL (ref 30.0–100.0)

## 2023-03-11 LAB — TSH: TSH: 0.468 u[IU]/mL (ref 0.450–4.500)

## 2023-04-05 ENCOUNTER — Encounter: Payer: Self-pay | Admitting: Family Medicine

## 2023-04-28 ENCOUNTER — Other Ambulatory Visit: Payer: Self-pay

## 2023-04-28 ENCOUNTER — Encounter: Payer: Self-pay | Admitting: Family Medicine

## 2023-04-28 ENCOUNTER — Ambulatory Visit (INDEPENDENT_AMBULATORY_CARE_PROVIDER_SITE_OTHER): Payer: Commercial Managed Care - PPO | Admitting: Family Medicine

## 2023-04-28 VITALS — BP 119/73 | HR 77 | Temp 98.3°F | Ht 66.54 in | Wt 146.0 lb

## 2023-04-28 DIAGNOSIS — R7989 Other specified abnormal findings of blood chemistry: Secondary | ICD-10-CM | POA: Diagnosis not present

## 2023-04-28 DIAGNOSIS — M2550 Pain in unspecified joint: Secondary | ICD-10-CM | POA: Diagnosis not present

## 2023-04-28 NOTE — Progress Notes (Signed)
[facility] [address] [place], [place] [address] Phone: [phone] Fax: [phone]   Office Visit Note  Patient Name: Jasmine Kane  Date of DGLOV:[id number]  Med Rec number [id number]  Date of Service: 04/28/2023  Mixed grasses  Chief Complaint  Patient presents with   joint pain      Here today to discuss her joint pains and Vitamin D See secure message from earlier this month  Joint pains started in knees late May after driving from [place] to [place] Pain is intermittent but was starting about 5 minutes after commencing driving. May be improving since increasing her Vitamin D dose  Soon after developed bilateral elbow pain, worse on the right - again intermittent. Then started with right wrist pain which has not resolved with 3 weeks of splint. Knees feel tight but no actual swelling noticed  F/H positive for thyroid issues and "autoimmune issues" in mother TSH borderline when checked earlier this year   Current Medication:  Outpatient Encounter Medications as of 04/28/2023  Medication Sig   Azelastine HCl 137 MCG/SPRAY SOLN Place into both nostrils.   fluticasone (FLONASE ALLERGY RELIEF) 50 MCG/ACT nasal spray Place 2 sprays into both nostrils daily.   levocetirizine (XYZAL) 5 MG tablet Take 2.5 mg by mouth every evening.   levonorgestrel (KYLEENA) 19.5 MG IUD 1 each by Intrauterine route once.   Montelukast Sodium (SINGULAIR PO) Singulair   No facility-administered encounter medications on file as of 04/28/2023.      Medical History: Past Medical History:  Diagnosis Date   Allergic rhinitis 04/08/2022   Allergies    Anxiety    Asthma      Vital Signs: There were no vitals taken for this visit.   Review of Systems  Physical Exam Vitals reviewed.  Constitutional:      Appearance: Normal appearance.  Musculoskeletal:        General: No swelling.     Right shoulder: Decreased range of motion.     Right elbow: No swelling. No  tenderness.     Left elbow: No swelling or effusion. No tenderness.     Right wrist: Tenderness present.     Right knee: No swelling. No tenderness.     Left knee: No swelling. No tenderness.     Comments: Increased range of motion at mtp joints due to lax ligaments  Neurological:     Mental Status: She is alert.     Assessment/Plan:  1. Arthralgia, unspecified joint If labs normal will refer to PT for specific joint/ligament/muscle strengthening and home exercise program - Antinuclear Antib (ANA) - C-reactive protein - TSH  2. Abnormal TSH  - TSH      General Counseling: Seraphim verbalizes understanding of the findings of todays visit and agrees with plan of treatment. I have discussed any further diagnostic evaluation that may be needed or ordered today. We also reviewed her medications today. she has been encouraged to call the office with any questions or concerns that should arise related to todays visit.   No orders of the defined types were placed in this encounter.   No orders of the defined types were placed in this encounter.    Dr Durwin Reges Jules Baty ABFM University Physician

## 2023-05-02 ENCOUNTER — Encounter: Payer: Self-pay | Admitting: Family Medicine

## 2023-05-02 LAB — C-REACTIVE PROTEIN: CRP: <1 mg/L (ref 0–10)

## 2023-05-02 LAB — ANA: Anti Nuclear Antibody (ANA): NEGATIVE

## 2023-05-02 LAB — TSH: TSH: 0.485 u[IU]/mL (ref 0.450–4.500)

## 2023-05-02 LAB — SPECIMEN STATUS REPORT

## 2023-05-03 NOTE — Progress Notes (Signed)
Good morning Jasmine Kane  The blood work was normal so it does not look like we are dealing with an autoimmune condition. However, since things seem to be getting worse I would like you to be seen by a rheumatologist to help determine the cause. Would you like me to go ahead and send the referral? While we are waiting on that, I want you to take Aleve (naproxen) two tablets two times a day with food for pain management  Dr Durwin Reges Jaxsun Ciampi ABFM University Physician

## 2023-05-08 ENCOUNTER — Other Ambulatory Visit: Payer: Self-pay | Admitting: Family Medicine

## 2023-05-08 DIAGNOSIS — M2550 Pain in unspecified joint: Secondary | ICD-10-CM

## 2023-05-08 DIAGNOSIS — R5383 Other fatigue: Secondary | ICD-10-CM

## 2023-05-22 IMAGING — CR DG CERVICAL SPINE COMPLETE 4+V
1 series · 5 of 5 positions shown · non-contrast
Comparison: None.

CLINICAL DATA: Neck pain

EXAM:
CERVICAL SPINE - COMPLETE 4+ VIEW

[Series 1: dg cervical spine complete · 0.14mm/px · 5 of 5 slices shown]
[im 1/5]
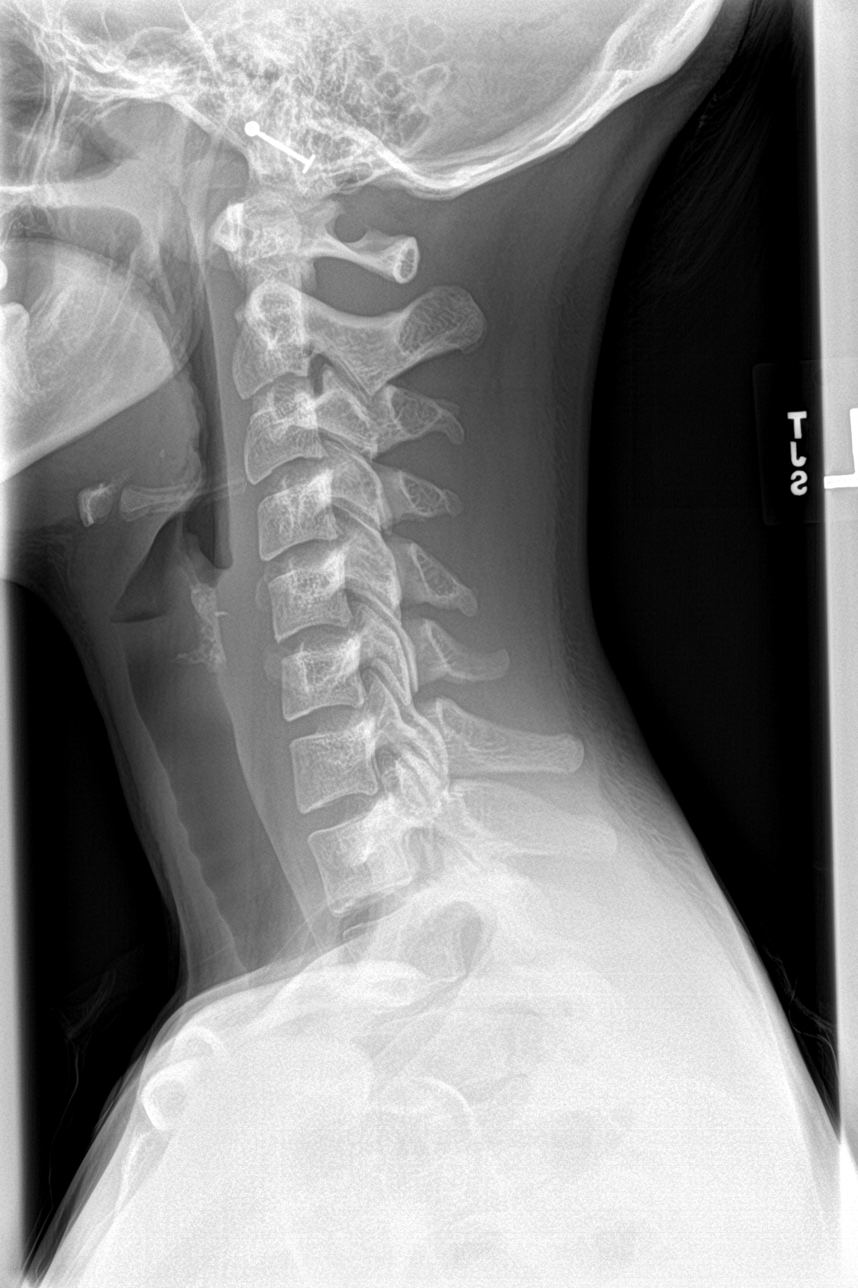
[im 2/5]
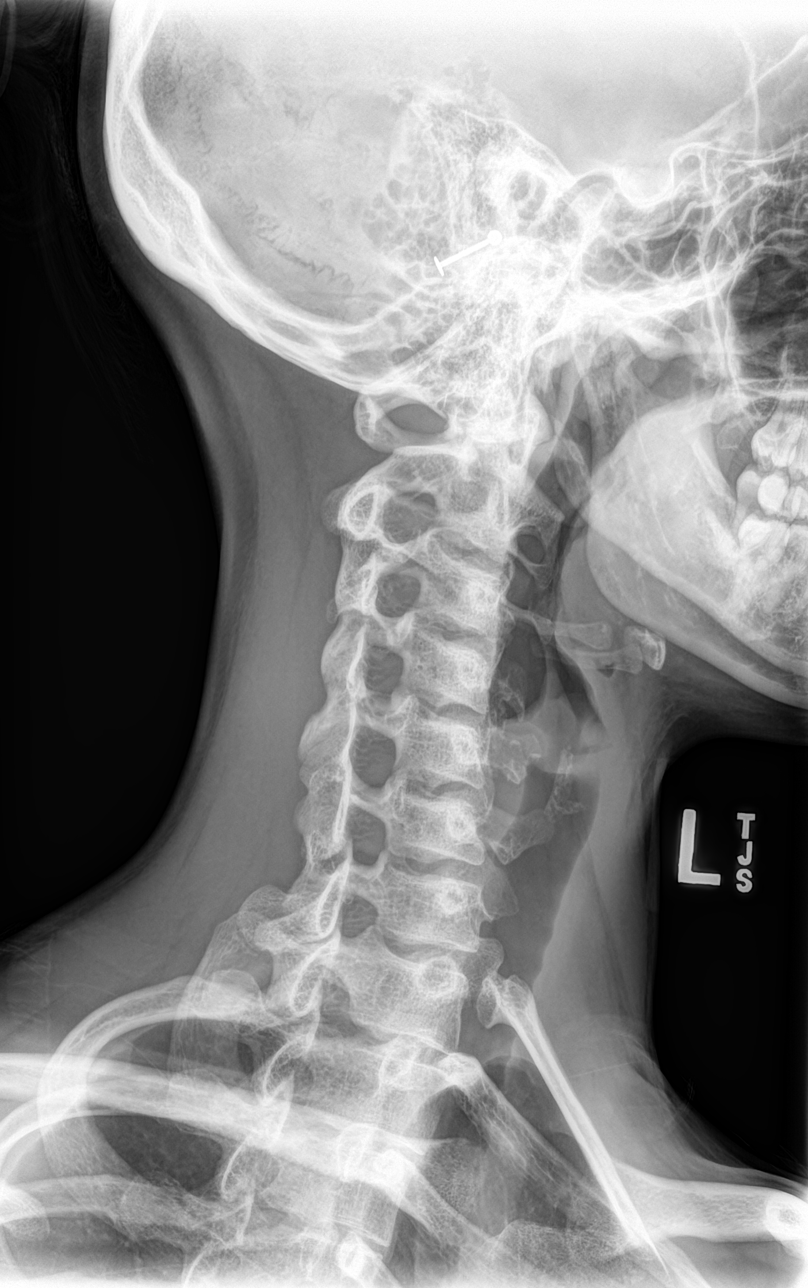
[im 3/5]
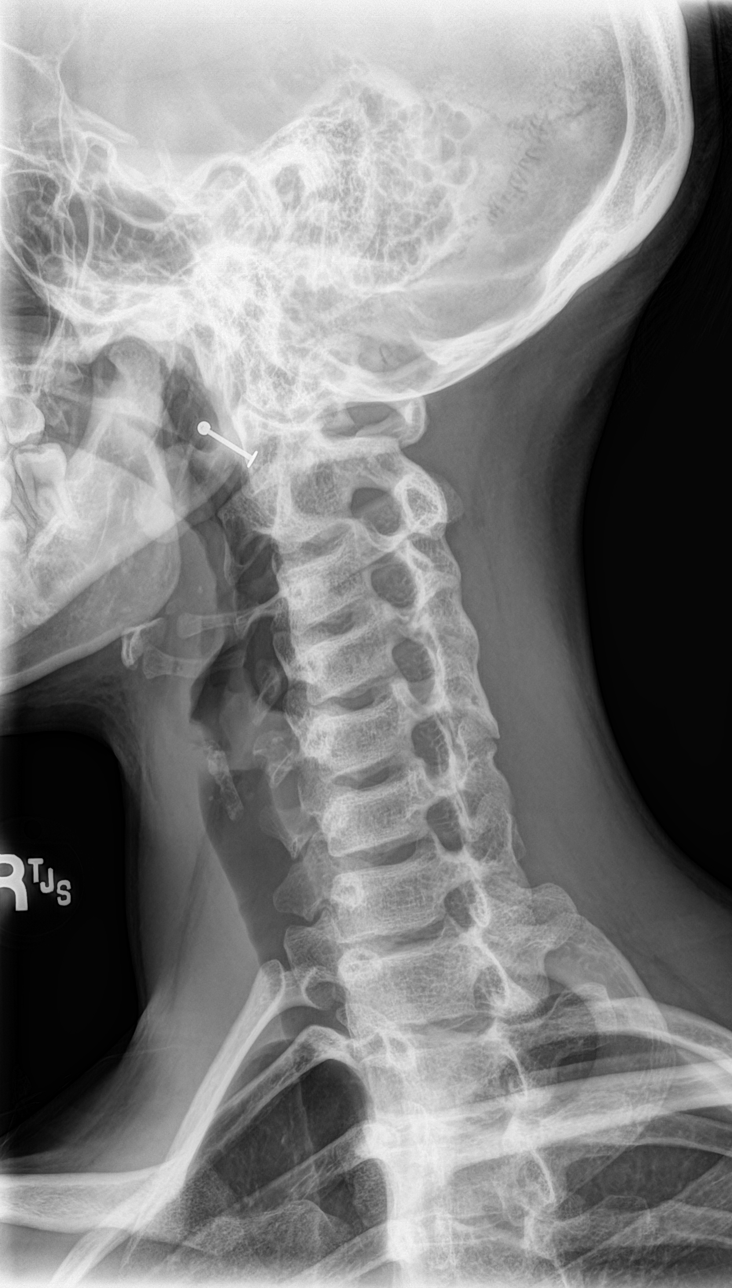
[im 4/5]
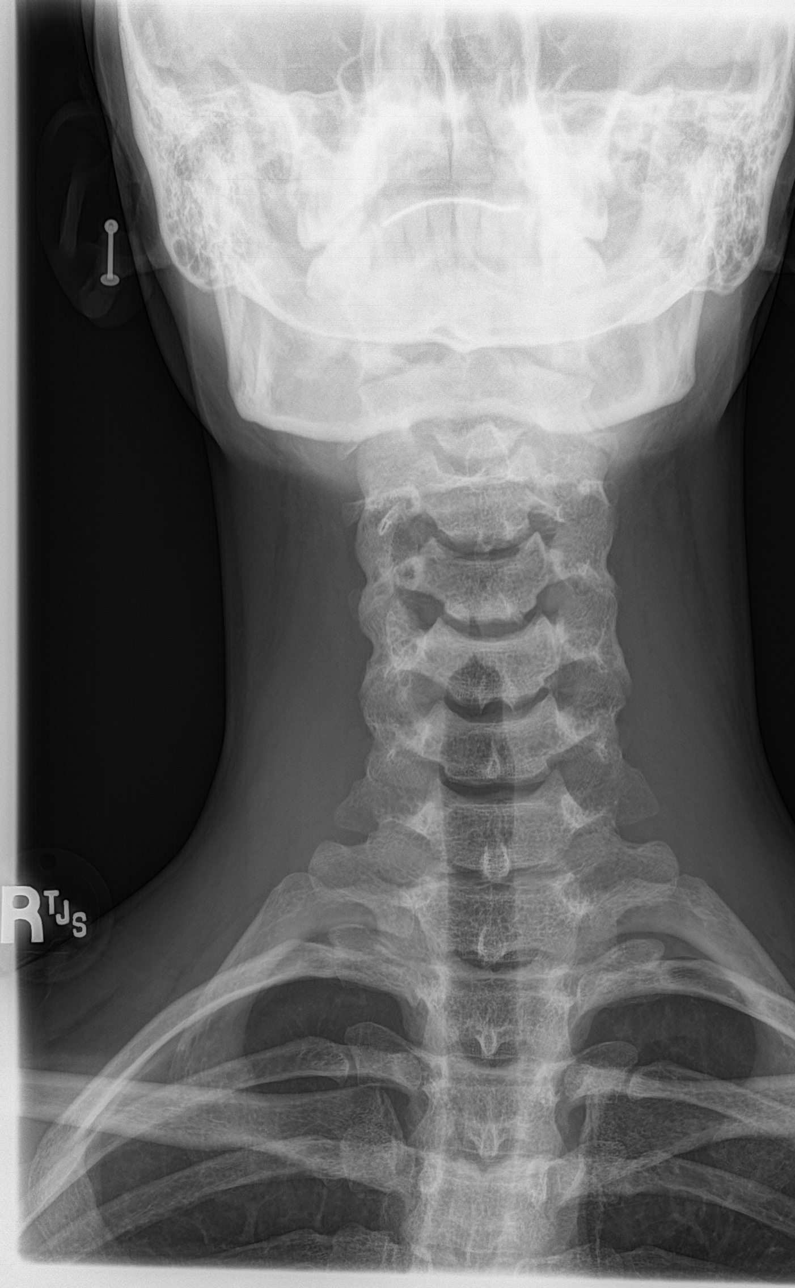
[im 5/5]
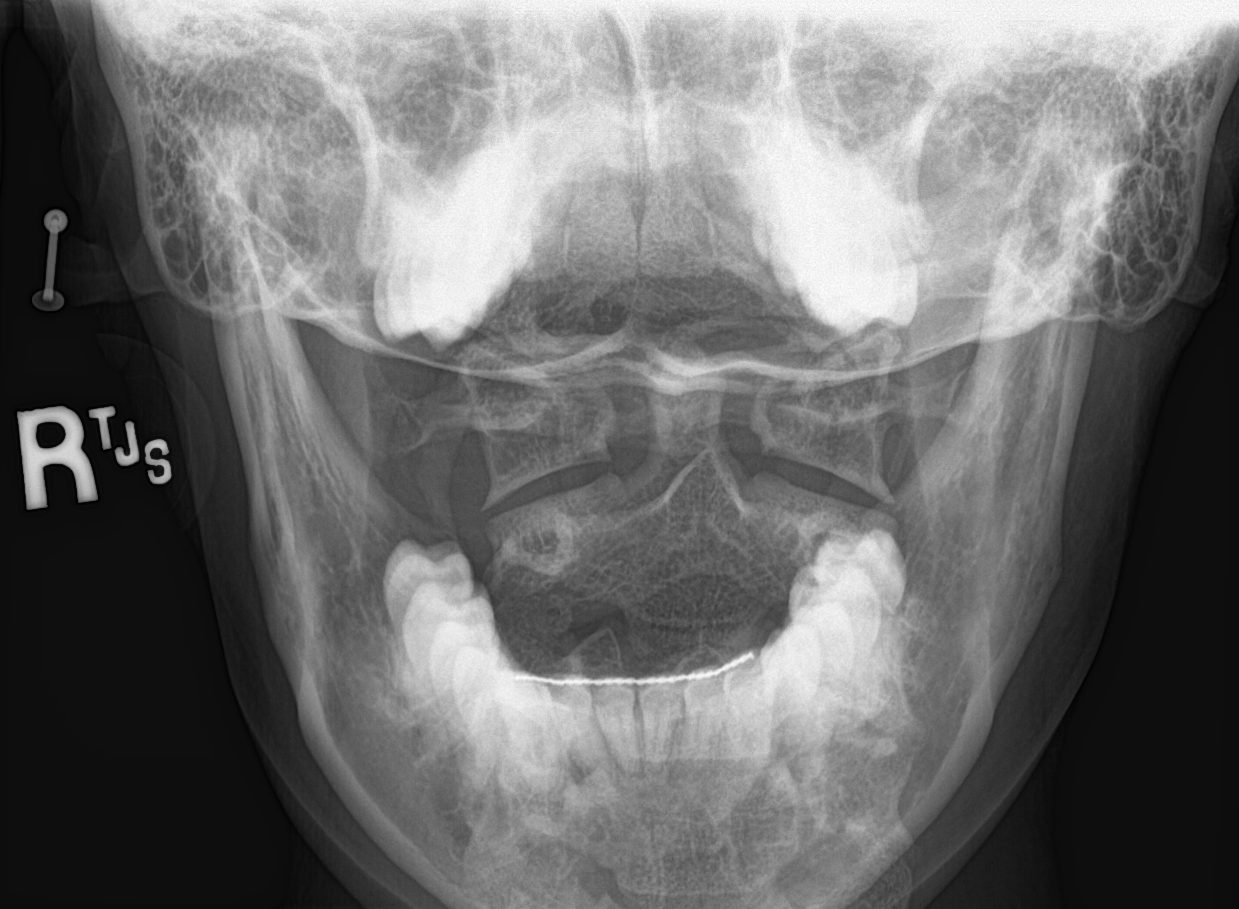

[5 of 5 positions shown; findings below may reference images not displayed]

FINDINGS: Straightening of the cervical spine. Vertebral body heights and disc
spaces appear within normal limits. Normal prevertebral soft tissue
thickness. The foramen are patent bilaterally. The dens and lateral
masses are normal
IMPRESSION: Straightening of the cervical spine.  Otherwise negative examination

## 2023-06-20 ENCOUNTER — Ambulatory Visit: Payer: Commercial Managed Care - PPO | Admitting: Nurse Practitioner

## 2023-06-20 VITALS — BP 102/61 | HR 98 | Temp 97.0°F | Resp 18 | Ht 66.0 in | Wt 143.0 lb

## 2023-06-20 DIAGNOSIS — J4540 Moderate persistent asthma, uncomplicated: Secondary | ICD-10-CM

## 2023-06-20 DIAGNOSIS — M2550 Pain in unspecified joint: Secondary | ICD-10-CM | POA: Diagnosis not present

## 2023-06-20 DIAGNOSIS — R7989 Other specified abnormal findings of blood chemistry: Secondary | ICD-10-CM | POA: Diagnosis not present

## 2023-06-20 DIAGNOSIS — F32A Depression, unspecified: Secondary | ICD-10-CM

## 2023-06-20 DIAGNOSIS — Z975 Presence of (intrauterine) contraceptive device: Secondary | ICD-10-CM

## 2023-06-20 DIAGNOSIS — Z23 Encounter for immunization: Secondary | ICD-10-CM

## 2023-06-20 DIAGNOSIS — Z114 Encounter for screening for human immunodeficiency virus [HIV]: Secondary | ICD-10-CM

## 2023-06-20 DIAGNOSIS — E559 Vitamin D deficiency, unspecified: Secondary | ICD-10-CM

## 2023-06-20 DIAGNOSIS — Z1159 Encounter for screening for other viral diseases: Secondary | ICD-10-CM

## 2023-06-20 DIAGNOSIS — F419 Anxiety disorder, unspecified: Secondary | ICD-10-CM

## 2023-06-20 MED ORDER — SERTRALINE HCL 25 MG PO TABS: 25.0000 mg | ORAL_TABLET | Freq: Every day | ORAL | 1 refills | Status: DC

## 2023-06-20 MED ORDER — MONTELUKAST SODIUM 10 MG PO TABS: 10.0000 mg | ORAL_TABLET | Freq: Every day | ORAL | 3 refills | Status: DC

## 2023-06-20 NOTE — Patient Instructions (Signed)
It was great to see you!  We are checking your labs today and will let you know the results via mychart/phone.   I have refilled your medications   Start zoloft 1 tablet daily for depression/anxiety.   Let's follow-up in 4-6 weeks, sooner if you have concerns.  If a referral was placed today, you will be contacted for an appointment. Please note that routine referrals can sometimes take up to 3-4 weeks to process. Please call our office if you haven't heard anything after this time frame.  Take care,  Rodman Pickle, NP

## 2023-06-20 NOTE — Progress Notes (Unsigned)
New Patient Visit  BP 102/61 (BP Location: Left Arm, Patient Position: Sitting, Cuff Size: Normal)   Pulse 98   Temp (!) 97 F (36.1 C) (Temporal)   Resp 18   Ht 5\' 6"  (1.676 m)   Wt 143 lb (64.9 kg)   SpO2 98%   BMI 23.08 kg/m    Subjective:    Patient ID: Jasmine Kane, female    DOB: 2000-10-27, 22 y.o.   MRN: [id number]  CC: Chief Complaint  Patient presents with   Establish Care    PT is here for established care. PT voiced concern with vitamin D deficiency, hair loss and joint and muscle pain.      HPI: Jasmine Kane is a 22 y.o. female presents for new patient visit to establish care.  Introduced to Publishing rights manager role and practice setting.  All questions answered.  Discussed provider/patient relationship and expectations.  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   She has been experiencing a lot of pain in joints and muscles. She feels like everything can be tight. This started in May. She states she first noticed it in her right knee and described it as instability and a cramping pain, especially when driving. Then she started to notice it in her hips, ankles, and shoulders. She also has a history of herniated discs in her back. She was given a muscle relaxer which helped with the back pain. She also tried Advil which has not helped. She had been following with the student health center at her school and labwork has thus far been normal. She has an appointment with rheumatology next month.   She states that she had one TSH level that was low, however the rest were normal. She denies chest pain, shortness of breath, diarrhea, constipation, and elevated heart rate.   She has a history of Vitamin D deficiency and is currently taking vitamin D 5,000 units daily.   She has a history of asthma and is currently taking breo ellipta inhaler daily, singulair 10mg  daily, and albuterol inhaler as needed.    She has a history of depression and anxiety. She had taken prozac in the past which made her more depressed. She then was started on zoloft and taking 100mg  which helped her symptoms. She states that school can be stressful and she is interested in re-starting her zoloft. She denies SI/HI.      06/20/2023    3:03 PM  Depression screen PHQ 2/9  Decreased Interest 1  Down, Depressed, Hopeless 2  PHQ - 2 Score 3  Altered sleeping 1  Tired, decreased energy 3  Change in appetite 0  Feeling bad or failure about yourself  1  Trouble concentrating 2  Moving slowly or fidgety/restless 1  Suicidal thoughts 0  PHQ-9 Score 11  Difficult doing work/chores Somewhat difficult      06/20/2023    3:04 PM  GAD 7 : Generalized Anxiety Score  Nervous, Anxious, on Edge 2  Control/stop worrying 2  Worry too much - different things 2  Trouble relaxing 2  Restless 2  Easily annoyed or irritable 2  Afraid - awful might happen 0  Total GAD 7 Score 12  Anxiety Difficulty Somewhat difficult    Past Medical History:  Diagnosis Date   Allergic rhinitis 04/08/2022   Allergies    Allergy    Anxiety    Asthma    Depression  Past Surgical History:  Procedure Laterality Date   ANKLE SURGERY Right    FOOT SURGERY Right    TONSILLECTOMY AND ADENOIDECTOMY     WISDOM TOOTH EXTRACTION      Family History  Problem Relation Age of Onset   Asthma Mother    Anxiety disorder Mother    Anxiety disorder Father    Depression Father    Asthma Brother    ADD / ADHD Brother    ADD / ADHD Brother    Asthma Brother    Cancer Maternal Grandmother        ovarian   Early death Maternal Grandmother    Heart disease Maternal Grandfather    Early death Maternal Grandfather    Cancer - Ovarian Paternal Grandmother      Social History   Tobacco Use   Smoking status: Never   Smokeless tobacco: Never  Vaping Use   Vaping status: Never Used  Substance Use Topics   Alcohol use: Not Currently     Comment: rarely   Drug use: Never    Current Outpatient Medications on File Prior to Visit  Medication Sig Dispense Refill   albuterol (VENTOLIN HFA) 108 (90 Base) MCG/ACT inhaler Inhale into the lungs every 6 (six) hours as needed for wheezing or shortness of breath.     fluticasone (FLONASE ALLERGY RELIEF) 50 MCG/ACT nasal spray Place 2 sprays into both nostrils daily.     levocetirizine (XYZAL) 5 MG tablet Take 2.5 mg by mouth every evening.     levonorgestrel (KYLEENA) 19.5 MG IUD 1 each by Intrauterine route once.     Azelastine HCl 137 MCG/SPRAY SOLN Place into both nostrils. (Patient not taking: Reported on 06/20/2023)     No current facility-administered medications on file prior to visit.     Review of Systems  Constitutional:  Positive for fatigue. Negative for fever.  HENT: Negative.    Respiratory: Negative.    Cardiovascular: Negative.   Gastrointestinal: Negative.   Genitourinary: Negative.   Musculoskeletal:  Positive for arthralgias and myalgias.  Skin: Negative.   Neurological: Negative.   Psychiatric/Behavioral:  Positive for dysphoric mood. The patient is nervous/anxious.       Objective:    BP 102/61 (BP Location: Left Arm, Patient Position: Sitting, Cuff Size: Normal)   Pulse 98   Temp (!) 97 F (36.1 C) (Temporal)   Resp 18   Ht 5\' 6"  (1.676 m)   Wt 143 lb (64.9 kg)   SpO2 98%   BMI 23.08 kg/m   Wt Readings from Last 3 Encounters:  06/20/23 143 lb (64.9 kg)  04/28/23 146 lb (66.2 kg)  01/17/23 144 lb (65.3 kg)    BP Readings from Last 3 Encounters:  06/20/23 102/61  04/28/23 119/73  03/10/23 101/78    Physical Exam Vitals and nursing note reviewed.  Constitutional:      General: She is not in acute distress.    Appearance: Normal appearance.  HENT:     Head: Normocephalic and atraumatic.     Right Ear: Tympanic membrane, ear canal and external ear normal.     Left Ear: Tympanic membrane, ear canal and external ear normal.  Eyes:      Conjunctiva/sclera: Conjunctivae normal.  Cardiovascular:     Rate and Rhythm: Normal rate and regular rhythm.     Pulses: Normal pulses.     Heart sounds: Normal heart sounds.  Pulmonary:     Effort: Pulmonary effort is normal.  Breath sounds: Normal breath sounds.  Abdominal:     Palpations: Abdomen is soft.     Tenderness: There is no abdominal tenderness.  Musculoskeletal:        General: Normal range of motion.     Cervical back: Normal range of motion and neck supple.     Right lower leg: No edema.     Left lower leg: No edema.  Lymphadenopathy:     Cervical: No cervical adenopathy.  Skin:    General: Skin is warm and dry.  Neurological:     General: No focal deficit present.     Mental Status: She is alert and oriented to person, place, and time.     Cranial Nerves: No cranial nerve deficit.     Coordination: Coordination normal.     Gait: Gait normal.  Psychiatric:        Mood and Affect: Mood normal.        Behavior: Behavior normal.        Thought Content: Thought content normal.        Judgment: Judgment normal.        Assessment & Plan:   Problem List Items Addressed This Visit       Respiratory   Moderate persistent asthma without complication    Chronic, stable. Continue breo ellipta inhaler daily, montelukast 10mg  daily and albuterol inhaler as needed. Refill sent to the pharmacy.       Relevant Medications   albuterol (VENTOLIN HFA) 108 (90 Base) MCG/ACT inhaler   montelukast (SINGULAIR) 10 MG tablet   fluticasone furoate-vilanterol (BREO ELLIPTA) 200-25 MCG/ACT AEPB     Other   Multiple joint pain - Primary    Chronic, not controlled. She has been experiencing pain in her knees, hips, elbows, back, and hands for the last 5 months. She [facility] Will check CMP, ANA, RF, lyme titer, magnesium, and RPR. She has an appointment with rheumatology. Encouraged her to keep this appointment.        Relevant Orders   Comprehensive metabolic panel (Completed)   RPR (Completed)   ANA w/Reflex (Completed)   Rheumatoid Factor (Completed)   Magnesium (Completed)   B. burgdorfi antibodies   Anxiety and depression    Chronic, not controlled. She was taking zoloft 100mg  daily and would like to restart this medication. Did discuss possibly starting cymbalta to help with joint pain, however she would like to take zoloft again since she knew she didn't have side effects. Start zoloft 25mg  daily. Follow-up in 4-6 weeks.       Relevant Medications   sertraline (ZOLOFT) 25 MG tablet   Vitamin D insufficiency    She is currently taking vitamin D 5,000 units daily. Will check vitamin D and adjust regimen based on results.       Relevant Orders   VITAMIN D 25 Hydroxy (Vit-D Deficiency, Fractures) (Completed)   IUD (intrauterine device) in place    Currently has kyleena inserted and follows routinely with GYN.      Other Visit Diagnoses     Low TSH level       She had 1 slightly low TSH level. Will repeat thyroid panel and TPO antibodies   Relevant Orders   Thyroid Panel With TSH (Completed)   Thyroid Peroxidase Antibodies (TPO) (REFL)   Screening for HIV (human immunodeficiency virus)       Screen HIV today   Relevant Orders   HIV Antibody (routine  testing w rflx) (Completed)   Encounter for hepatitis C screening test for low risk patient       Screen hepatitis C today   Relevant Orders   Hepatitis C antibody (Completed)   Immunization due       Flu vaccine given today   Relevant Orders   Flu vaccine trivalent PF, 6mos and older(Flulaval,Afluria,Fluarix,Fluzone) (Completed)        Follow up plan: Return in about 4 weeks (around 07/18/2023) for 4-6 weeks , Anxiety, Depression, can be virtual.  Gerre Scull

## 2023-06-21 DIAGNOSIS — J4540 Moderate persistent asthma, uncomplicated: Secondary | ICD-10-CM | POA: Insufficient documentation

## 2023-06-21 DIAGNOSIS — M2550 Pain in unspecified joint: Secondary | ICD-10-CM | POA: Insufficient documentation

## 2023-06-21 DIAGNOSIS — F32A Depression, unspecified: Secondary | ICD-10-CM | POA: Insufficient documentation

## 2023-06-21 DIAGNOSIS — E559 Vitamin D deficiency, unspecified: Secondary | ICD-10-CM | POA: Insufficient documentation

## 2023-06-21 DIAGNOSIS — Z975 Presence of (intrauterine) contraceptive device: Secondary | ICD-10-CM | POA: Insufficient documentation

## 2023-06-21 LAB — ANA W/REFLEX: Anti Nuclear Antibody (ANA): NEGATIVE

## 2023-06-21 MED ORDER — FLUTICASONE FUROATE-VILANTEROL 200-25 MCG/ACT IN AEPB: 1.0000 | INHALATION_SPRAY | Freq: Every day | RESPIRATORY_TRACT | 3 refills | Status: DC

## 2023-06-21 NOTE — Assessment & Plan Note (Signed)
Chronic, not controlled. She has been experiencing pain in her knees, hips, elbows, back, and hands for the last 5 months. She [facility]. Will check CMP, ANA, RF, lyme titer, magnesium, and RPR. She has an appointment with rheumatology. Encouraged her to keep this appointment.

## 2023-06-21 NOTE — Assessment & Plan Note (Signed)
Currently has kyleena inserted and follows routinely with GYN.

## 2023-06-21 NOTE — Assessment & Plan Note (Signed)
She is currently taking vitamin D 5,000 units daily. Will check vitamin D and adjust regimen based on results.

## 2023-06-21 NOTE — Assessment & Plan Note (Signed)
Chronic, not controlled. She was taking zoloft 100mg  daily and would like to restart this medication. Did discuss possibly starting cymbalta to help with joint pain, however she would like to take zoloft again since she knew she didn't have side effects. Start zoloft 25mg  daily. Follow-up in 4-6 weeks.

## 2023-06-21 NOTE — Assessment & Plan Note (Signed)
Chronic, stable. Continue breo ellipta inhaler daily, montelukast 10mg  daily and albuterol inhaler as needed. Refill sent to the pharmacy.

## 2023-06-24 LAB — COMPREHENSIVE METABOLIC PANEL
AG Ratio: 2.3 (calc) (ref 1.0–2.5)
ALT: 15 U/L (ref 6–29)
AST: 16 U/L (ref 10–30)
Albumin: 4.6 g/dL (ref 3.6–5.1)
Alkaline phosphatase (APISO): 59 U/L (ref 31–125)
BUN: 18 mg/dL (ref 7–25)
CO2: 28 mmol/L (ref 20–32)
Calcium: 9.3 mg/dL (ref 8.6–10.2)
Chloride: 103 mmol/L (ref 98–110)
Creat: 0.73 mg/dL (ref 0.50–0.96)
Globulin: 2 g/dL (ref 1.9–3.7)
Glucose, Bld: 85 mg/dL (ref 65–99)
Potassium: 3.9 mmol/L (ref 3.5–5.3)
Sodium: 138 mmol/L (ref 135–146)
Total Bilirubin: 0.5 mg/dL (ref 0.2–1.2)
Total Protein: 6.6 g/dL (ref 6.1–8.1)

## 2023-06-24 LAB — SPECIMEN COMPROMISED

## 2023-06-24 LAB — B. BURGDORFI ANTIBODIES: B burgdorferi Ab IgG+IgM: <0.9 {index}

## 2023-06-24 LAB — VITAMIN D 25 HYDROXY (VIT D DEFICIENCY, FRACTURES): Vit D, 25-Hydroxy: 32 ng/mL (ref 30–100)

## 2023-06-24 LAB — RHEUMATOID FACTOR: Rheumatoid fact SerPl-aCnc: <10 [IU]/mL (ref <14)

## 2023-06-24 LAB — RPR: RPR Ser Ql: NONREACTIVE

## 2023-06-24 LAB — THYROID PANEL WITH TSH
Free Thyroxine Index: 2.2 (ref 1.4–3.8)
T3 Uptake: 31 % (ref 22–35)
T4, Total: 7.1 ug/dL (ref 5.1–11.9)
TSH: 0.54 m[IU]/L

## 2023-06-24 LAB — HEPATITIS C ANTIBODY: Hepatitis C Ab: NONREACTIVE

## 2023-06-24 LAB — HIV ANTIBODY (ROUTINE TESTING W REFLEX): HIV 1&2 Ab, 4th Generation: NONREACTIVE

## 2023-06-24 LAB — MAGNESIUM: Magnesium: 1.9 mg/dL (ref 1.5–2.5)

## 2023-07-12 ENCOUNTER — Other Ambulatory Visit: Payer: Self-pay | Admitting: Nurse Practitioner

## 2023-07-18 ENCOUNTER — Encounter: Payer: Self-pay | Admitting: Nurse Practitioner

## 2023-07-18 ENCOUNTER — Telehealth (INDEPENDENT_AMBULATORY_CARE_PROVIDER_SITE_OTHER): Payer: Commercial Managed Care - PPO | Admitting: Nurse Practitioner

## 2023-07-18 DIAGNOSIS — F419 Anxiety disorder, unspecified: Secondary | ICD-10-CM | POA: Diagnosis not present

## 2023-07-18 DIAGNOSIS — F32A Depression, unspecified: Secondary | ICD-10-CM | POA: Diagnosis not present

## 2023-07-18 DIAGNOSIS — M2550 Pain in unspecified joint: Secondary | ICD-10-CM

## 2023-07-18 NOTE — Progress Notes (Signed)
[facility] [address] [place] [address] Dept: [phone] Dept Fax: [phone]  Virtual Video Visit  I connected with Jasmine Kane on 07/18/23 at  2:00 PM EST by a video enabled telemedicine application and verified that I am speaking with the correct person using two identifiers.  Location patient: Home Location provider: Clinic Persons participating in the virtual visit: Patient; Rodman Pickle, NP; Malena Peer, CMA  I discussed the limitations of evaluation and management by telemedicine and the availability of in person appointments. The patient expressed understanding and agreed to proceed.  Chief Complaint  Patient presents with   Follow-up    SUBJECTIVE:  HPI: Jasmine Kane is a 22 y.o. female who presents to follow-up on anxiety and depression.   She states that her mood has been doing better since she started the medication.  She did [place] any side effects to the medication.  She feels like she is on a good dose currently.  She denies SI/HI.  She also feels like her joint pain is getting slightly better over the last month.     07/18/2023    1:51 PM 06/20/2023    3:03 PM  Depression screen PHQ 2/9  Decreased Interest 0 1  Down, Depressed, Hopeless 0 2  PHQ - 2 Score 0 3  Altered sleeping  1  Tired, decreased energy  3  Change in appetite  0  Feeling bad or failure about yourself   1  Trouble concentrating  2  Moving slowly or fidgety/restless  1  Suicidal thoughts  0  PHQ-9 Score  11  Difficult doing work/chores  Somewhat difficult      06/20/2023    3:04 PM  GAD 7 : Generalized Anxiety Score  Nervous, Anxious, on Edge 2  Control/stop worrying 2  Worry too much - different things 2  Trouble relaxing 2  Restless 2  Easily annoyed or irritable 2  Afraid - awful might happen 0  Total GAD 7 Score 12  Anxiety Difficulty Somewhat difficult    Patient Active Problem List    Diagnosis Date Noted   Multiple joint pain 06/21/2023   Moderate persistent asthma without complication 06/21/2023   Anxiety and depression 06/21/2023   Vitamin D insufficiency 06/21/2023   IUD (intrauterine device) in place 06/21/2023    Past Surgical History:  Procedure Laterality Date   ANKLE SURGERY Right    FOOT SURGERY Right    TONSILLECTOMY AND ADENOIDECTOMY     WISDOM TOOTH EXTRACTION      Family History  Problem Relation Age of Onset   Asthma Mother    Anxiety disorder Mother    Anxiety disorder Father    Depression Father    Asthma Brother    ADD / ADHD Brother    ADD / ADHD Brother    Asthma Brother    Cancer Maternal Grandmother        ovarian   Early death Maternal Grandmother    Heart disease Maternal Grandfather    Early death Maternal Grandfather    Cancer - Ovarian Paternal Grandmother     Social History   Tobacco Use   Smoking status: Never   Smokeless tobacco: Never  Vaping Use   Vaping status: Never Used  Substance Use Topics   Alcohol use: Not Currently    Comment: rarely   Drug use: Never     Current Outpatient Medications:    albuterol (VENTOLIN HFA) 108 (90 Base) MCG/ACT inhaler,  Inhale into the lungs every 6 (six) hours as needed for wheezing or shortness of breath., Disp: , Rfl:    fluticasone (FLONASE ALLERGY RELIEF) 50 MCG/ACT nasal spray, Place 2 sprays into both nostrils daily., Disp: , Rfl:    fluticasone furoate-vilanterol (BREO ELLIPTA) 200-25 MCG/ACT AEPB, Inhale 1 puff into the lungs daily., Disp: 60 each, Rfl: 3   levocetirizine (XYZAL) 5 MG tablet, Take 2.5 mg by mouth every evening., Disp: , Rfl:    levonorgestrel (KYLEENA) 19.5 MG IUD, 1 each by Intrauterine route once., Disp: , Rfl:    montelukast (SINGULAIR) 10 MG tablet, Take 1 tablet (10 mg total) by mouth at bedtime., Disp: 90 tablet, Rfl: 3   sertraline (ZOLOFT) 25 MG tablet, TAKE 1 TABLET (25 MG TOTAL) BY MOUTH DAILY., Disp: 90 tablet, Rfl: 1   Azelastine HCl 137  MCG/SPRAY SOLN, Place into both nostrils. (Patient not taking: Reported on 06/20/2023), Disp: , Rfl:   Allergies  Allergen Reactions   Mixed Grasses Other (See Comments)    ROS: See pertinent positives and negatives per HPI.  OBSERVATIONS/OBJECTIVE:  VITALS per patient if applicable: There were no vitals filed for this visit. There is no height or weight on file to calculate BMI.    GENERAL: Alert and oriented. Appears well and in no acute distress.  HEENT: Atraumatic. Conjunctiva clear. No obvious abnormalities on inspection of external nose and ears.  NECK: Normal movements of the head and neck.  LUNGS: On inspection, no signs of respiratory distress. Breathing rate appears normal. No obvious gross SOB, gasping or wheezing, and no conversational dyspnea.  CV: No obvious cyanosis.  MS: Moves all visible extremities without noticeable abnormality.  PSYCH/NEURO: Pleasant and cooperative. No obvious depression or anxiety. Speech and thought processing grossly intact.  ASSESSMENT AND PLAN:  Problem List Items Addressed This Visit       Other   Multiple joint pain    Chronic, stable.  She states that she is still having joint pain, however it has gotten slightly better.  She has also been trying to not pay too much attention to it.  She still has a appointment with rheumatology next month, encouraged her to keep this.  Follow-up in 6 to 7 months.      Anxiety and depression - Primary    Chronic, stable.  Continue sertraline 25 mg daily.  She does not need a refill today.  Follow-up in 6 months.        I discussed the assessment and treatment plan with the patient. The patient was provided an opportunity to ask questions and all were answered. The patient agreed with the plan and demonstrated an understanding of the instructions.   The patient was advised to call back or seek an in-person evaluation if the symptoms worsen or if the condition fails to improve as  anticipated.   Gerre Scull, NP

## 2023-07-18 NOTE — Assessment & Plan Note (Signed)
Chronic, stable.  She states that she is still having joint pain, however it has gotten slightly better.  She has also been trying to not pay too much attention to it.  She still has a appointment with rheumatology next month, encouraged her to keep this.  Follow-up in 6 to 7 months.

## 2023-07-18 NOTE — Patient Instructions (Signed)
It was great to see you!  Keep taking the zoloft 1 tablet daily.   Let's follow-up in 6 months, sooner if you have concerns.  If a referral was placed today, you will be contacted for an appointment. Please note that routine referrals can sometimes take up to 3-4 weeks to process. Please call our office if you haven't heard anything after this time frame.  Take care,  Rodman Pickle, NP

## 2023-07-18 NOTE — Assessment & Plan Note (Addendum)
Chronic, stable.  Continue sertraline 25 mg daily.  She does not need a refill today.  Follow-up in 6 months.

## 2023-09-23 NOTE — Progress Notes (Deleted)
 Office Visit Note  Patient: Jasmine Kane             Date of Birth: 05/06/2001           MRN: [id number]             PCP: Gerre Scull, NP Referring: Noralee Stain, MD Visit Date: 10/06/2023 Occupation: @GUAROCC @  Subjective:  No chief complaint on file.   History of Present Illness: Jasmine Kane is a 23 y.o. female ***     Activities of Daily Living:  Patient reports morning stiffness for *** {minute/hour:19697}.   Patient {ACTIONS;DENIES/REPORTS:21021675::"Denies"} nocturnal pain.  Difficulty dressing/grooming: {ACTIONS;DENIES/REPORTS:21021675::"Denies"} Difficulty climbing stairs: {ACTIONS;DENIES/REPORTS:21021675::"Denies"} Difficulty getting out of chair: {ACTIONS;DENIES/REPORTS:21021675::"Denies"} Difficulty using hands for taps, buttons, cutlery, and/or writing: {ACTIONS;DENIES/REPORTS:21021675::"Denies"}  No Rheumatology ROS completed.   PMFS History:  Patient Active Problem List   Diagnosis Date Noted   Multiple joint pain 06/21/2023   Moderate persistent asthma without complication 06/21/2023   Anxiety and depression 06/21/2023   Vitamin D insufficiency 06/21/2023   IUD (intrauterine device) in place 06/21/2023    Past Medical History:  Diagnosis Date   Allergic rhinitis 04/08/2022   Allergies    Allergy    Anxiety    Asthma    Depression     Family History  Problem Relation Age of Onset   Asthma Mother    Anxiety disorder Mother    Anxiety disorder Father    Depression Father    Asthma Brother    ADD / ADHD Brother    ADD / ADHD Brother    Asthma Brother    Cancer Maternal Grandmother        ovarian   Early death Maternal Grandmother    Heart disease Maternal Grandfather    Early death Maternal Grandfather    Cancer - Ovarian Paternal Grandmother    Past Surgical History:  Procedure Laterality Date   ANKLE SURGERY Right    FOOT SURGERY Right    TONSILLECTOMY AND ADENOIDECTOMY     WISDOM TOOTH EXTRACTION     Social History    Social History Narrative   Not on file   Immunization History  Administered Date(s) Administered   HPV 9-valent 04/02/2014, 06/29/2015   HPV Quadrivalent 06/10/2014   Influenza, Seasonal, Injecte, Preservative Fre 06/20/2023   Tdap 03/28/2022     Objective: Vital Signs: There were no vitals taken for this visit.   Physical Exam   Musculoskeletal Exam: ***  CDAI Exam: CDAI Score: -- Patient Global: --; Provider Global: -- Swollen: --; Tender: -- Joint Exam 10/06/2023   No joint exam has been documented for this visit   There is currently no information documented on the homunculus. Go to the Rheumatology activity and complete the homunculus joint exam.  Investigation: No additional findings.  Imaging: No results found.  Recent Labs: Lab Results  Component Value Date   WBC 5.3 01/17/2023   HGB 14.4 01/17/2023   PLT 187 01/17/2023   NA 138 06/20/2023   K 3.9 06/20/2023   CL 103 06/20/2023   CO2 28 06/20/2023   GLUCOSE 85 06/20/2023   BUN 18 06/20/2023   CREATININE 0.73 06/20/2023   BILITOT 0.5 06/20/2023   ALKPHOS 85 01/17/2023   AST 16 06/20/2023   ALT 15 06/20/2023   PROT 6.6 06/20/2023   ALBUMIN 4.5 01/17/2023   CALCIUM 9.3 06/20/2023    Speciality Comments: No specialty comments available.  Procedures:  No procedures performed Allergies: Mixed grasses   Assessment / Plan:  Visit Diagnoses: Polyarthralgia - 04/28/23: ANA negative, CRP WNL, TSH WNL, B burgdorferi ab-, Mg 1.9, RF<10, Hep C negative, HIV negative, RPR negative  Other fatigue  Vitamin D insufficiency  Moderate persistent asthma without complication  Anxiety and depression  IUD (intrauterine device) in place  Orders: No orders of the defined types were placed in this encounter.  No orders of the defined types were placed in this encounter.   Face-to-face time spent with patient was *** minutes. Greater than 50% of time was spent in counseling and coordination of  care.  Follow-Up Instructions: No follow-ups on file.   Gearldine Bienenstock, PA-C  Note - This record has been created using Dragon software.  Chart creation errors have been sought, but may not always  have been located. Such creation errors do not reflect on  the standard of medical care.

## 2023-10-06 ENCOUNTER — Encounter: Payer: Self-pay | Admitting: Rheumatology

## 2023-10-06 DIAGNOSIS — R5383 Other fatigue: Secondary | ICD-10-CM

## 2023-10-06 DIAGNOSIS — J4540 Moderate persistent asthma, uncomplicated: Secondary | ICD-10-CM

## 2023-10-06 DIAGNOSIS — M2550 Pain in unspecified joint: Secondary | ICD-10-CM

## 2023-10-06 DIAGNOSIS — F32A Depression, unspecified: Secondary | ICD-10-CM

## 2023-10-06 DIAGNOSIS — E559 Vitamin D deficiency, unspecified: Secondary | ICD-10-CM

## 2023-10-06 DIAGNOSIS — Z975 Presence of (intrauterine) contraceptive device: Secondary | ICD-10-CM

## 2023-11-03 ENCOUNTER — Ambulatory Visit: Payer: Self-pay | Admitting: Rheumatology

## 2023-11-16 ENCOUNTER — Other Ambulatory Visit: Payer: Self-pay | Admitting: Nurse Practitioner

## 2023-11-17 NOTE — Telephone Encounter (Signed)
 RequestingEarlie Server 200-25 MCG INHALR  Last Visit: 06/20/2023 Next Visit: Visit date not found Last Refill: 06/21/2023  Please Advise

## 2023-12-29 ENCOUNTER — Ambulatory Visit: Admitting: Nurse Practitioner

## 2023-12-29 ENCOUNTER — Telehealth: Payer: Self-pay | Admitting: Nurse Practitioner

## 2023-12-29 NOTE — Telephone Encounter (Signed)
 Called pt in reference to her question about s referral and explained about documentation and that yes an appt was needed.

## 2023-12-29 NOTE — Telephone Encounter (Signed)
 Noted.

## 2024-01-10 ENCOUNTER — Other Ambulatory Visit: Payer: Self-pay | Admitting: Nurse Practitioner

## 2024-01-12 NOTE — Telephone Encounter (Signed)
 Requesting: SERTRALINE  HCL 25 MG TABLET  Last Visit: 06/20/2023 Next Visit: Visit date not found Last Refill: 07/14/2023  Please Advise

## 2024-01-19 ENCOUNTER — Other Ambulatory Visit: Payer: Self-pay | Admitting: Nurse Practitioner

## 2024-01-19 NOTE — Telephone Encounter (Signed)
 Requesting: FLUTICASONE -VILANTEROL 200-25  Last Visit: 06/20/2023 Next Visit: Visit date not found Last Refill: 11/17/2023  Please Advise

## 2024-02-08 ENCOUNTER — Other Ambulatory Visit: Payer: Self-pay | Admitting: Nurse Practitioner

## 2024-02-10 NOTE — Telephone Encounter (Signed)
 Requesting:  SERTRALINE  HCL 25 MG TABLET  Last Visit: 07/18/2023 Next Visit: Visit date not found Last Refill: 01/12/2024  Please Advise

## 2024-07-01 ENCOUNTER — Other Ambulatory Visit: Payer: Self-pay | Admitting: Nurse Practitioner

## 2024-07-01 NOTE — Telephone Encounter (Signed)
 Requesting: MONTELUKAST  SOD 10 MG TABLET  Last Visit: 07/18/2023 Next Visit: Visit date not found Last Refill: 06/19/2024  Please Advise   Patient needs an appointment for additional refills.

## 2024-08-01 ENCOUNTER — Other Ambulatory Visit: Payer: Self-pay | Admitting: Nurse Practitioner

## 2024-08-02 NOTE — Telephone Encounter (Signed)
 Requesting: MONTELUKAST  SOD 10 MG TABLET  Last Visit: 07/18/2023 Next Visit: Visit date not found Last Refill: 07/01/2024  Please Advise

## 2024-08-05 ENCOUNTER — Other Ambulatory Visit: Payer: Self-pay | Admitting: Nurse Practitioner

## 2024-08-05 NOTE — Telephone Encounter (Signed)
 Requesting: MONTELUKAST  SOD 10 MG TABLET  Last Visit: 07/18/2023 Next Visit: Visit date not found Last Refill: 07/01/2024  Please Advise   Patient needs an appointment and mychart message sent to patient
